# Patient Record
Sex: Male | Born: 1985 | Hispanic: No | Marital: Single | Smoking: Never smoker
Health system: Southern US, Community
[De-identification: ages and names within clinical notes are randomized; demographics above are authoritative.]

## PROBLEM LIST (undated history)

## (undated) DIAGNOSIS — R569 Unspecified convulsions: Secondary | ICD-10-CM

---
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
## 2009-02-16 ENCOUNTER — Emergency Department (HOSPITAL_COMMUNITY): Admission: EM | Admit: 2009-02-16 | Discharge: 2009-02-16 | Payer: Self-pay | Admitting: Ophthalmology

## 2009-07-02 ENCOUNTER — Inpatient Hospital Stay (HOSPITAL_COMMUNITY): Admission: EM | Admit: 2009-07-02 | Discharge: 2009-07-08 | Payer: Self-pay | Admitting: Emergency Medicine

## 2009-07-23 ENCOUNTER — Ambulatory Visit: Payer: Self-pay | Admitting: Physician Assistant

## 2009-07-23 DIAGNOSIS — R030 Elevated blood-pressure reading, without diagnosis of hypertension: Secondary | ICD-10-CM

## 2009-07-23 DIAGNOSIS — R569 Unspecified convulsions: Secondary | ICD-10-CM | POA: Insufficient documentation

## 2009-07-23 DIAGNOSIS — H6120 Impacted cerumen, unspecified ear: Secondary | ICD-10-CM

## 2009-07-23 LAB — CONVERTED CEMR LAB
AST: 19 units/L (ref 0–37)
Alkaline Phosphatase: 89 units/L (ref 39–117)
Amphetamine Screen, Ur: NEGATIVE
BUN: 11 mg/dL (ref 6–23)
Barbiturate Quant, Ur: NEGATIVE
Benzodiazepines.: NEGATIVE
Blood Glucose, Fingerstick: 64
Cocaine Metabolites: NEGATIVE
Creatinine, Ser: 1 mg/dL (ref 0.40–1.50)
Marijuana Metabolite: NEGATIVE
Methadone: NEGATIVE
Phenytoin Lvl: 13.9 ug/mL (ref 10.0–20.0)
Total Bilirubin: 0.6 mg/dL (ref 0.3–1.2)

## 2009-07-24 ENCOUNTER — Encounter: Payer: Self-pay | Admitting: Physician Assistant

## 2009-07-31 ENCOUNTER — Telehealth: Payer: Self-pay | Admitting: Physician Assistant

## 2009-08-11 ENCOUNTER — Telehealth: Payer: Self-pay | Admitting: Physician Assistant

## 2009-08-12 ENCOUNTER — Encounter: Payer: Self-pay | Admitting: Physician Assistant

## 2009-08-17 ENCOUNTER — Encounter (INDEPENDENT_AMBULATORY_CARE_PROVIDER_SITE_OTHER): Payer: Self-pay | Admitting: *Deleted

## 2009-10-09 ENCOUNTER — Telehealth: Payer: Self-pay | Admitting: Physician Assistant

## 2009-10-19 ENCOUNTER — Encounter (INDEPENDENT_AMBULATORY_CARE_PROVIDER_SITE_OTHER): Payer: Self-pay | Admitting: *Deleted

## 2009-11-13 ENCOUNTER — Ambulatory Visit: Payer: Self-pay | Admitting: Physician Assistant

## 2009-11-13 ENCOUNTER — Telehealth: Payer: Self-pay | Admitting: Physician Assistant

## 2009-11-16 LAB — CONVERTED CEMR LAB
BUN: 14 mg/dL (ref 6–23)
CO2: 28 meq/L (ref 19–32)
Calcium: 9.9 mg/dL (ref 8.4–10.5)
Glucose, Bld: 60 mg/dL — ABNORMAL LOW (ref 70–99)
Potassium: 5.2 meq/L (ref 3.5–5.3)

## 2009-11-18 ENCOUNTER — Ambulatory Visit: Payer: Self-pay | Admitting: Physician Assistant

## 2009-11-19 LAB — CONVERTED CEMR LAB: Phenytoin Lvl: 5.7 ug/mL — ABNORMAL LOW (ref 10.0–20.0)

## 2009-11-26 ENCOUNTER — Encounter (INDEPENDENT_AMBULATORY_CARE_PROVIDER_SITE_OTHER): Payer: Self-pay | Admitting: *Deleted

## 2009-12-10 ENCOUNTER — Telehealth (INDEPENDENT_AMBULATORY_CARE_PROVIDER_SITE_OTHER): Payer: Self-pay | Admitting: Nurse Practitioner

## 2010-02-10 ENCOUNTER — Telehealth: Payer: Self-pay | Admitting: Physician Assistant

## 2010-08-10 NOTE — Progress Notes (Signed)
Summary: needs refill on seizure meds  Phone Note Call from Patient Call back at [phone]   Reason for Call: Refill Medication Summary of Call: weaver pt. Douglas Rivas called to say that he finished his medicine and he needs another refill on the seizure medication. he uses [facility] [address]. Initial call taken by: Leodis Rains,  February 10, 2010 11:02 AM  Follow-up for Phone Call        forward to Takita Riecke Follow-up by: Armenia Shannon,  February 11, 2010 7:50 AM  Additional Follow-up for Phone Call Additional follow up Details #1::        I have repeatedly asked for him to be seen.  I have not seen him since his initial visit.  Why have I not seen him?  I cannot keep refilling a medicine for him when I have no follow up with him.  Schedule an appt and let me know date and time.   Additional Follow-up by: Tereso Newcomer PA-C,  February 11, 2010 8:54 AM    Additional Follow-up for Phone Call Additional follow up Details #2::    i spoke with pt and schedule the appt for aug. 30 Follow-up by: Armenia Shannon,  February 11, 2010 11:25 AM  Additional Follow-up for Phone Call Additional follow up Details #3:: Details for Additional Follow-up Action Taken: Rx in basket to fax. Tereso Newcomer PA-C  February 11, 2010 4:51 PM  Spoke with pt's father he confirmed appt. advised him we will fax med to [facility]. Gaylyn Cheers RN  February 12, 2010 9:52 AM    Prescriptions: DILANTIN 100 MG CAPS (PHENYTOIN SODIUM EXTENDED) Two tablets by mouth at bedtime  #60 x 0   Entered and Authorized by:   Tereso Newcomer PA-C   Signed by:   Tereso Newcomer PA-C on 02/11/2010   Method used:   Printed then faxed to ...         RxID:   [id number]

## 2010-08-10 NOTE — Letter (Signed)
Summary: *HSN Results Follow up  [facility]  [address] [place], [place] [address]   Phone: [phone]  Fax: [phone]      11/26/2009   Douglas Rivas [address] [place], [place]  [address]   Dear  Mr. Shalin Hulgan,                            ____S.Drinkard,FNP   ____D. Gore,FNP       ____B. McPherson,MD   ____V. Rankins,MD    ____E. Mulberry,MD    ____N. Daphine Deutscher, FNP  ____D. Reche Dixon, MD    ____K. Philipp Deputy, MD    ____Other     This letter is to inform you that your recent test(s):  _______Pap Smear    ___X____Lab Test     _______X-ray    _______ is within acceptable limits  ___X____ requires a medication change  ____X___ requires a follow-up lab visit  _______ requires a follow-up visit with your provider   Comments:  We have been trying to reach you.  Please give the office a call at your earliest convenience.       _________________________________________________________ If you have any questions, please contact our office                     Sincerely,  Arm[place] Shannon [facility]

## 2010-08-10 NOTE — Assessment & Plan Note (Signed)
Summary: XFU SEIZURES/LR   Vital Signs:  Patient profile:   25 year old male Height:      63 inches Weight:      107.1 pounds BMI:     19.04 Temp:     98.0 degrees F oral Pulse rate:   80 / minute Pulse rhythm:   regular Resp:     17 per minute BP sitting:   147 / 83  (left arm) Cuff size:   regular  Vitals Entered By: Geanie Cooley (July 23, 2009 11:31 AM) CC: one time hospital followup,  pt has had a seizure Is Patient Diabetic? Yes Did you bring your meter with you today? No Pain Assessment Patient in pain? no      CBG Result 64  Does patient need assistance? Functional Status Self care Ambulation Normal    Primary Care Provider:  Tereso Newcomer, PA-C  CC:  one time hospital followup and pt has had a seizure.  History of Present Illness: New patient.  No prior medical care.  Just d/c from hosp. with seizures. Just dx with seizure d/o.  Place on dilantin.  Ran out of meds.  Developed ARF in setting of dehydration.  CT of head concerning for sinus thrombosis.  F/u MRI was normal.  EEG demonstrated bilateral frontal, L>R mild cortical irritability; however, no definite epileptiform features noted.  Spoke with neurologist.  No definite need for f/u with him.  If pt. prefers, he can f/u once or arrange routine f/u. No seizures since d/c.  Taking meds.  No n/v/d.  No difficulty urinating.  No chest pain.  No syncope.  Habits & Providers  Alcohol-Tobacco-Diet     Tobacco Status: never  Exercise-Depression-Behavior     Drug Use: no  Allergies (verified): No Known Drug Allergies                                                                                                                                         Past History:  Past Medical History: Seizure disorder  Past Surgical History: Denies surgical history  Family History: Family History Diabetes 1st degree relative - mom Family History Seizures - mom  Social History: Never Smoked Alcohol  use-no Drug use-no Originally from [place] Single Smoking Status:  never Drug Use:  no  Review of Systems General:  Denies chills and fever. ENT:  Denies earache and sore throat. CV:  Denies chest pain or discomfort. Resp:  Complains of cough; denies sputum productive. GI:  Denies bloody stools and dark tarry stools. MS:  Denies joint pain. Derm:  Denies lesion(s).  Physical Exam  General:  alert, well-developed, and well-nourished.   Head:  normocephalic and atraumatic.   Eyes:  pupils equal, pupils round, and pupils reactive to light.   Ears:  R cerumen impaction and L Cerumen impaction.   Nose:  no external deformity.   Mouth:  pharynx  pink and moist.   Neck:  supple.   Lungs:  normal breath sounds, no crackles, and no wheezes.   Heart:  normal rate and regular rhythm.   Abdomen:  soft, non-tender, and normal bowel sounds.   Neurologic:  alert & oriented X3 and cranial nerves II-XII intact.   Psych:  normally interactive.     Impression & Recommendations:  Problem # 1:  SEIZURE DISORDER (ICD-780.39)  check dilantin level continue meds spoke with neuro pt. can f/u with neuro if desires . . . pt wants to wait until medicaid kicks in  Orders: T-Dilantin (Phenytoin) ([id number])  His updated medication list for this problem includes:    Dilantin 100 Mg Caps (Phenytoin sodium extended) .Marland KitchenMarland KitchenMarland KitchenMarland Kitchen 3 tablets at bedtime  Problem # 2:  Preventive Health Care (ICD-V70.0) sugar high x 1 in hosp. . . ? if he is diabetic  other sugars ok sugar ok today no h/o DM . .  . check A1C with labs  elevated LFTs in hosp . .  . f/u on LFTs  Orders: T-Comprehensive Metabolic Panel [phone]) T- Hemoglobin A1C [phone]) T-Drug Screen-Urine, (single) [id number]) T-HIV Antibody  (Reflex) [id number]) T-Syphilis Test (RPR) [phone]) T-TSH [phone])  Problem # 3:  ELEVATED BP READING WITHOUT DX HYPERTENSION (ICD-796.2)  monitor  start meds if does not  improve  Orders: T-Comprehensive Metabolic Panel ([id number])  Problem # 4:  CERUMEN IMPACTION, BILATERAL (ICD-380.4) irrigation in clinic today  Complete Medication List: 1)  Dilantin 100 Mg Caps (Phenytoin sodium extended) .... 3 tablets at bedtime  Other Orders: Capillary Blood Glucose/CBG ([id number])  Patient Instructions: 1)  Return in 3 weeks for blood pressure check. 2)  Please schedule a follow-up appointment in 6 weeks with Scott for blood pressure and seizures. 3)  Return sooner if you have a seizure Prescriptions: DILANTIN 100 MG CAPS (PHENYTOIN SODIUM EXTENDED) 3 tablets at bedtime  #90 x 5   Entered and Authorized by:   Tereso Newcomer PA-C   Signed by:   Tereso Newcomer PA-C on 07/23/2009   Method used:   Print then Give to Patient   RxID:   [id number]    Vital Signs:  Patient profile:   25 year old male Height:      63 inches Weight:      107.1 pounds BMI:     19.04 Temp:     98.0 degrees F oral Pulse rate:   80 / minute Pulse rhythm:   regular Resp:     17 per minute BP sitting:   147 / 83  (left arm) Cuff size:   regular  Vitals Entered By: Geanie Cooley (July 23, 2009 11:31 AM)

## 2010-08-10 NOTE — Letter (Signed)
Summary: TRIAGE CALL A NURSE  TRIAGE CALL A NURSE   Imported By: Arta Bruce 10/06/2009 14:54:28  _____________________________________________________________________  External Attachment:    Type:   Image     Comment:   External Document

## 2010-08-10 NOTE — Progress Notes (Signed)
  Phone Note Outgoing Call   Summary of Call: Patient called Douglas Rivas. due to inability to get through on our phones. Having lethargy and dizziness. He was told to hold his dilantin x 1 day and then reduce to 2 tablets daily. He needs to come in Monday for a dilantin level. Add on to my schedule Monday as well. Go to the ED this weekend if he feels worse. Initial call taken by: Brynda Rim,  July 31, 2009 4:44 PM  Follow-up for Phone Call        contacted pt advised him not to take the Dilantin today and start sat taking the Dilantin two times a day instead of three times a day, scheduled appt for pt to come in on Monday 08/03/2009 at 11:15am Follow-up by: Geanie Cooley ,  July 31, 2009 4:55 PM

## 2010-08-10 NOTE — Progress Notes (Signed)
  Phone Note Call from Patient   Reason for Call: Refill Medication Summary of Call: NEEDS REFILL ON DILANTIN//[facility] [address]  [phone] Initial call taken by: Arta Bruce,  Nov 13, 2009 9:39 AM  Follow-up for Phone Call        pt can not get refill on meds til pt has appt time and date... Left message on answering machine for pt to call back.... Follow-up by: Arm[place] Shannon,  Nov 13, 2009 11:54 AM  Additional Follow-up for Phone Call Additional follow up Details #1::        pt is aware via kim.... Nakeysha Pasqual just wanted you to know pt's doesn't have update orange card so i told Selena Batten to see if she can get him appts to redo card and to see you afterwards... is there anything different you would like for me to do  Additional Follow-up by: Arm[place] Shannon,  Nov 13, 2009 12:11 PM    Additional Follow-up for Phone Call Additional follow up Details #2::    Needs dilantin level drawn today. Then can give him Rx for 2 weeks. Needs appt. Rx in your basket. Follow-up by: Tereso Newcomer PA-C,  Nov 13, 2009 1:01 PM  Additional Follow-up for Phone Call Additional follow up Details #3:: Details for Additional Follow-up Action Taken: pt is aware Additional Follow-up by: Arm[place] Shannon,  Nov 16, 2009 12:53 PM  Prescriptions: DILANTIN 100 MG CAPS (PHENYTOIN SODIUM EXTENDED) 2 tablets at bedtime  #30 x 0   Entered and Authorized by:   Tereso Newcomer PA-C   Signed by:   Tereso Newcomer PA-C on 11/13/2009   Method used:   Print then Give to Patient   RxID:   [id number]

## 2010-08-10 NOTE — Progress Notes (Signed)
Summary: Dilantin Refill  Phone Note Refill Request Message from:  Patient on December 10, 2009 10:00 AM  Refills Requested: Medication #1:  DILANTIN 100 MG CAPS 2 tablets at bedtime. PT NEEDS TO GET ORANGE CARD RENEWED.... Jordan Hawks Samson Frederic... PT SAYS HE IS GOING TOMORROW TO GET ORANGE CARD.Marland Kitchen   Method Requested: Fax to Local Pharmacy Initial call taken by: Leodis Rains,  December 10, 2009 9:59 AM  Follow-up for Phone Call        30 day supply of meds [facility]ed to [facility] until orange card renewed notify pt Follow-up by: Lehman Prom FNP,  December 10, 2009 10:08 AM  Additional Follow-up for Phone Call Additional follow up Details #1::        pt's father is aware Additional Follow-up by: Arm[place] Shannon,  December 10, 2009 11:23 AM    New/Updated Medications: DILANTIN 100 MG CAPS (PHENYTOIN SODIUM EXTENDED) Two tablets by mouth at bedtime Prescriptions: DILANTIN 100 MG CAPS (PHENYTOIN SODIUM EXTENDED) Two tablets by mouth at bedtime  #60 x 0   Entered and Authorized by:   Lehman Prom FNP   Signed by:   Lehman Prom FNP on 12/10/2009   Method used:   Electronically to        [facility] [address] (retail)       [phone]       [place], [place]  [address]       Ph: [phone]       Fax: [phone]   RxID:   [id number]

## 2010-08-10 NOTE — Progress Notes (Signed)
Summary: REFILL ON DIALANTIN  Phone Note Call from Patient Call back at Home Phone [phone]   Reason for Call: Refill Medication Summary of Call: NEEDS REFILL ON DILANTIN//[facility] [address] Initial call taken by: Arta Bruce,  October 09, 2009 9:57 AM  Follow-up for Phone Call        forward to provider......... Follow-up by: Mikey College CMA,  October 09, 2009 10:47 AM  Additional Follow-up for Phone Call Additional follow up Details #1::        patient with multiple no shows he needs a f/u appt ASAP I will not refill this med again until I see him he was supposed to f/u weeks ago as we thought he was having side effects  Additional Follow-up by: Brynda Rim,  October 09, 2009 3:01 PM    Additional Follow-up for Phone Call Additional follow up Details #2::    Left message on answering machine for pt to call back.Marland KitchenMarland KitchenMarland KitchenArmenia Shannon  October 12, 2009 6:24 PM left message on answer machine for pt. to return call Gaylyn Cheers RN  October 13, 2009 10:29 AM   Left message on answering machine for pt to call back.... Armenia Shannon  October 14, 2009 12:49 PM.  Left message on answering machine for pt to call back.... will mail letter.... Arm[place] Shannon  October 19, 2009 10:23 AM    New/Updated Medications: DILANTIN 100 MG CAPS (PHENYTOIN SODIUM EXTENDED) 2 tablets at bedtime Prescriptions: DILANTIN 100 MG CAPS (PHENYTOIN SODIUM EXTENDED) 2 tablets at bedtime  #60 x 0   Entered and Authorized by:   Tereso Newcomer PA-C   Signed by:   Tereso Newcomer PA-C on 10/09/2009   Method used:   Electronically to        [facility] [address] (retail)       [phone]       [place], [place]  [address]       Ph: [phone]       Fax: [phone]   RxID:   [id number]

## 2010-08-10 NOTE — Letter (Signed)
Summary: *HSN Results Follow up  [facility]  [address] [place], [place] [address]   Phone: [phone]  Fax: [phone]      10/19/2009   Quindon Friedmann [address] [place], [place]  [address]   Dear  Mr. Jettson Droege,                            ____S.Drinkard,FNP   ____D. Gore,FNP       ____B. McPherson,MD   ____V. Rankins,MD    ____E. Mulberry,MD    ____N. Daphine Deutscher, FNP  ____D. Reche Dixon, MD    ____K. Philipp Deputy, MD    ____Other     This letter is to inform you that your recent test(s):  _______Pap Smear    _______Lab Test     _______X-ray    _______ is within acceptable limits  _______ requires a medication change  _______ requires a follow-up lab visit  ___X____ requires a follow-up visit with your provider   Comments:  We have been trying to reach you.  Please give the office a call at your earliest convenience.       _________________________________________________________ If you have any questions, please contact our office                     Sincerely,  Arm[place] Shannon [facility]

## 2010-08-10 NOTE — Progress Notes (Signed)
Summary: REFILL ON SEIZURE MEDS  Phone Note Call from Patient Call back at Home Phone [phone]   Reason for Call: Refill Medication Summary of Call: WE[facility]ER PT. MR Sauerwein CALLED AND SAYS THAT HE LOST HIS DIALANTIN RX THAT YOU WROT FOR HIM ON HIS LAST VISIT AND HE WOULD IIKE FOR [place] TO CALL IT INTO [facility] [address]. HE SAYS THAT HE TAKES HIS LAST ONE TODAY. Initial call taken by: Leodis Rains,  August 11, 2009 12:54 PM  Follow-up for Phone Call        The pt is very desperate because he states that he needs his medication by today.Manon Hilding  August 11, 2009 3:45 PM  Additional Follow-up for Phone Call Additional follow up Details #1::        pt has refills at pharmacy..... Left message on answering machine for pt to call back...Marland KitchenMarland KitchenArmenia Shannon  August 13, 2009 5:08 PM    Left message on answering machine for pt to call back.Marland KitchenMarland KitchenMarland KitchenArmenia Shannon  August 14, 2009 3:46 PM     Additional Follow-up for Phone Call Additional follow up Details #2::    Left message on answering machine for pt to call back.Marland KitchenMarland KitchenArmenia Shannon  August 17, 2009 1:05 PM

## 2010-08-10 NOTE — Letter (Signed)
Summary: *HSN Results Follow up  [facility]  [address] [place], [place] [address]   Phone: [phone]  Fax: [phone]      07/24/2009   Douglas Rivas [address] [place], [place]  [address]   Dear  Mr. [name],                            ____S.Drinkard,FNP   ____D. Gore,FNP       ____B. McPherson,MD   ____V. Rankins,MD    ____E. Mulberry,MD    ____N. Daphine Deutscher, FNP  ____D. Reche Dixon, MD    ____K. Philipp Deputy, MD    __x__S. Alben Spittle, PA-C     This letter is to inform you that your recent test(s):  _______Pap Smear    ___x____Lab Test     _______X-ray    ___x____ is within acceptable limits  _______ requires a medication change  _______ requires a follow-up lab visit  _______ requires a follow-up visit with your provider   Comments:  You do not have diabetes.  Your dilantin level looks good.  Everything else was normal.         _________________________________________________________ If you have any questions, please contact our office                     Sincerely,  Tereso Newcomer PA-C [facility]

## 2010-08-10 NOTE — Letter (Signed)
Summary: *HSN Results Follow up  [facility]  [address] [place], [place] [address]   Phone: [phone]  Fax: [phone]      08/17/2009   Fatih Ruperto [address] [place], [place]  [address]   Dear  Mr. Rulon Stubbe,                            ____S.Drinkard,FNP   ____D. Gore,FNP       ____B. McPherson,MD   ____V. Rankins,MD    ____E. Mulberry,MD    ____N. Daphine Deutscher, FNP  ____D. Reche Dixon, MD    ____K. Philipp Deputy, MD    ____Other     This letter is to inform you that your recent test(s):  _______Pap Smear    _______Lab Test     _______X-ray    _______ is within acceptable limits  _______ requires a medication change  _______ requires a follow-up lab visit  _______ requires a follow-up visit with your provider   Comments:  We have been trying to reach you.  Please give the office a call at your earliest convenience.       _________________________________________________________ If you have any questions, please contact our office                     Sincerely,  [place] Shannon [facility]

## 2010-10-11 LAB — GLUCOSE, CAPILLARY
Glucose-Capillary: 103 mg/dL — ABNORMAL HIGH (ref 70–99)
Glucose-Capillary: 113 mg/dL — ABNORMAL HIGH (ref 70–99)
Glucose-Capillary: 121 mg/dL — ABNORMAL HIGH (ref 70–99)
Glucose-Capillary: 126 mg/dL — ABNORMAL HIGH (ref 70–99)
Glucose-Capillary: 139 mg/dL — ABNORMAL HIGH (ref 70–99)
Glucose-Capillary: 63 mg/dL — ABNORMAL LOW (ref 70–99)
Glucose-Capillary: 65 mg/dL — ABNORMAL LOW (ref 70–99)
Glucose-Capillary: 75 mg/dL (ref 70–99)
Glucose-Capillary: 76 mg/dL (ref 70–99)
Glucose-Capillary: 81 mg/dL (ref 70–99)
Glucose-Capillary: 88 mg/dL (ref 70–99)
Glucose-Capillary: 88 mg/dL (ref 70–99)
Glucose-Capillary: 91 mg/dL (ref 70–99)
Glucose-Capillary: 94 mg/dL (ref 70–99)
Glucose-Capillary: 97 mg/dL (ref 70–99)

## 2010-10-11 LAB — RAPID URINE DRUG SCREEN, HOSP PERFORMED
Benzodiazepines: NOT DETECTED
Cocaine: NOT DETECTED
Opiates: NOT DETECTED

## 2010-10-11 LAB — CBC
HCT: 43.5 % (ref 39.0–52.0)
HCT: 43.9 % (ref 39.0–52.0)
HCT: 50.9 % (ref 39.0–52.0)
Hemoglobin: 15.7 g/dL (ref 13.0–17.0)
Hemoglobin: 18.4 g/dL — ABNORMAL HIGH (ref 13.0–17.0)
MCHC: 34 g/dL (ref 30.0–36.0)
MCHC: 34.6 g/dL (ref 30.0–36.0)
MCHC: 34.9 g/dL (ref 30.0–36.0)
MCHC: 35 g/dL (ref 30.0–36.0)
MCV: 91.9 fL (ref 78.0–100.0)
MCV: 92.5 fL (ref 78.0–100.0)
MCV: 94.2 fL (ref 78.0–100.0)
Platelets: 165 10*3/uL (ref 150–400)
Platelets: 168 10*3/uL (ref 150–400)
Platelets: 178 10*3/uL (ref 150–400)
Platelets: 184 10*3/uL (ref 150–400)
Platelets: 209 10*3/uL (ref 150–400)
RBC: 4.8 MIL/uL (ref 4.22–5.81)
RBC: 5.49 MIL/uL (ref 4.22–5.81)
RBC: 5.75 MIL/uL (ref 4.22–5.81)
RDW: 12.5 % (ref 11.5–15.5)
RDW: 12.5 % (ref 11.5–15.5)
RDW: 12.5 % (ref 11.5–15.5)
RDW: 12.6 % (ref 11.5–15.5)
WBC: 17.7 10*3/uL — ABNORMAL HIGH (ref 4.0–10.5)
WBC: 6.6 10*3/uL (ref 4.0–10.5)

## 2010-10-11 LAB — DIFFERENTIAL
Basophils Absolute: 0 10*3/uL (ref 0.0–0.1)
Basophils Absolute: 0 10*3/uL (ref 0.0–0.1)
Basophils Absolute: 0.1 10*3/uL (ref 0.0–0.1)
Basophils Relative: 0 % (ref 0–1)
Basophils Relative: 0 % (ref 0–1)
Basophils Relative: 1 % (ref 0–1)
Eosinophils Absolute: 0 10*3/uL (ref 0.0–0.7)
Eosinophils Absolute: 0.1 10*3/uL (ref 0.0–0.7)
Eosinophils Relative: 0 % (ref 0–5)
Eosinophils Relative: 0 % (ref 0–5)
Eosinophils Relative: 1 % (ref 0–5)
Eosinophils Relative: 2 % (ref 0–5)
Lymphocytes Relative: 28 % (ref 12–46)
Lymphocytes Relative: 47 % — ABNORMAL HIGH (ref 12–46)
Lymphocytes Relative: 9 % — ABNORMAL LOW (ref 12–46)
Monocytes Absolute: 0.5 10*3/uL (ref 0.1–1.0)
Monocytes Absolute: 0.7 10*3/uL (ref 0.1–1.0)
Monocytes Relative: 5 % (ref 3–12)
Monocytes Relative: 7 % (ref 3–12)
Neutro Abs: 3.9 10*3/uL (ref 1.7–7.7)
Neutro Abs: 4.4 10*3/uL (ref 1.7–7.7)
Neutro Abs: 8.4 10*3/uL — ABNORMAL HIGH (ref 1.7–7.7)
Neutrophils Relative %: 47 % (ref 43–77)
Neutrophils Relative %: 66 % (ref 43–77)

## 2010-10-11 LAB — HEPATIC FUNCTION PANEL
AST: 241 U/L — ABNORMAL HIGH (ref 0–37)
Albumin: 3.9 g/dL (ref 3.5–5.2)
Alkaline Phosphatase: 64 U/L (ref 39–117)
Bilirubin, Direct: 0.1 mg/dL (ref 0.0–0.3)
Total Bilirubin: 0.5 mg/dL (ref 0.3–1.2)

## 2010-10-11 LAB — PHENYTOIN LEVEL, FREE AND TOTAL
Phenytoin Bound: 17.9 ug/mL
Phenytoin, Free: 1.78 ug/mL (ref 1.00–2.00)

## 2010-10-11 LAB — URINALYSIS, MICROSCOPIC ONLY
Bilirubin Urine: NEGATIVE
Glucose, UA: NEGATIVE mg/dL
Ketones, ur: 15 mg/dL — AB
Protein, ur: NEGATIVE mg/dL
pH: 6 (ref 5.0–8.0)

## 2010-10-11 LAB — BASIC METABOLIC PANEL
BUN: 13 mg/dL (ref 6–23)
BUN: 3 mg/dL — ABNORMAL LOW (ref 6–23)
BUN: 4 mg/dL — ABNORMAL LOW (ref 6–23)
CO2: 19 mEq/L (ref 19–32)
CO2: 25 mEq/L (ref 19–32)
CO2: 25 mEq/L (ref 19–32)
CO2: 30 mEq/L (ref 19–32)
CO2: 31 mEq/L (ref 19–32)
Calcium: 8.1 mg/dL — ABNORMAL LOW (ref 8.4–10.5)
Calcium: 8.3 mg/dL — ABNORMAL LOW (ref 8.4–10.5)
Calcium: 8.8 mg/dL (ref 8.4–10.5)
Chloride: 103 mEq/L (ref 96–112)
Chloride: 105 mEq/L (ref 96–112)
Chloride: 106 mEq/L (ref 96–112)
Chloride: 107 mEq/L (ref 96–112)
Creatinine, Ser: 0.95 mg/dL (ref 0.4–1.5)
Creatinine, Ser: 1.1 mg/dL (ref 0.4–1.5)
Creatinine, Ser: 1.12 mg/dL (ref 0.4–1.5)
Creatinine, Ser: 1.26 mg/dL (ref 0.4–1.5)
GFR calc Af Amer: >60 mL/min (ref >60)
GFR calc Af Amer: >60 mL/min (ref >60)
GFR calc Af Amer: >60 mL/min (ref >60)
GFR calc non Af Amer: >60 mL/min (ref >60)
GFR calc non Af Amer: >60 mL/min (ref >60)
Glucose, Bld: 108 mg/dL — ABNORMAL HIGH (ref 70–99)
Glucose, Bld: 68 mg/dL — ABNORMAL LOW (ref 70–99)
Potassium: 3.5 mEq/L (ref 3.5–5.1)
Potassium: 4.1 mEq/L (ref 3.5–5.1)
Potassium: 4.3 mEq/L (ref 3.5–5.1)
Sodium: 134 mEq/L — ABNORMAL LOW (ref 135–145)
Sodium: 136 mEq/L (ref 135–145)
Sodium: 141 mEq/L (ref 135–145)

## 2010-10-11 LAB — COMPREHENSIVE METABOLIC PANEL
Albumin: 4.5 g/dL (ref 3.5–5.2)
Alkaline Phosphatase: 102 U/L (ref 39–117)
BUN: 13 mg/dL (ref 6–23)
Calcium: 9.3 mg/dL (ref 8.4–10.5)
Glucose, Bld: 281 mg/dL — ABNORMAL HIGH (ref 70–99)
Potassium: 2.9 mEq/L — ABNORMAL LOW (ref 3.5–5.1)
Total Protein: 8.5 g/dL — ABNORMAL HIGH (ref 6.0–8.3)

## 2010-10-11 LAB — MAGNESIUM
Magnesium: 1.8 mg/dL (ref 1.5–2.5)
Magnesium: 2.5 mg/dL (ref 1.5–2.5)

## 2010-10-11 LAB — PATHOLOGIST SMEAR REVIEW

## 2010-10-11 LAB — URINE CULTURE
Colony Count: NO GROWTH
Special Requests: NEGATIVE

## 2010-10-11 LAB — PHENYTOIN LEVEL, TOTAL: Phenytoin Lvl: 25.7 ug/mL — ABNORMAL HIGH (ref 10.0–20.0)

## 2010-10-16 LAB — CBC
HCT: 54.1 % — ABNORMAL HIGH (ref 39.0–52.0)
Hemoglobin: 18.4 g/dL — ABNORMAL HIGH (ref 13.0–17.0)
MCHC: 34.1 g/dL (ref 30.0–36.0)
Platelets: 222 10*3/uL (ref 150–400)
RDW: 13.1 % (ref 11.5–15.5)

## 2010-10-16 LAB — COMPREHENSIVE METABOLIC PANEL
Alkaline Phosphatase: 97 U/L (ref 39–117)
BUN: 11 mg/dL (ref 6–23)
Calcium: 9.2 mg/dL (ref 8.4–10.5)
Creatinine, Ser: 1.32 mg/dL (ref 0.4–1.5)
Glucose, Bld: 120 mg/dL — ABNORMAL HIGH (ref 70–99)
Total Protein: 8.9 g/dL — ABNORMAL HIGH (ref 6.0–8.3)

## 2010-10-16 LAB — URINALYSIS, ROUTINE W REFLEX MICROSCOPIC
Ketones, ur: NEGATIVE mg/dL
Nitrite: NEGATIVE
Specific Gravity, Urine: 1.016 (ref 1.005–1.030)
pH: 6 (ref 5.0–8.0)

## 2010-10-16 LAB — PATHOLOGIST SMEAR REVIEW

## 2010-10-16 LAB — DIFFERENTIAL
Eosinophils Relative: 1 % (ref 0–5)
Lymphocytes Relative: 47 % — ABNORMAL HIGH (ref 12–46)
Monocytes Absolute: 1.1 10*3/uL — ABNORMAL HIGH (ref 0.1–1.0)
Monocytes Relative: 7 % (ref 3–12)
Neutrophils Relative %: 45 % (ref 43–77)

## 2010-10-16 LAB — RAPID URINE DRUG SCREEN, HOSP PERFORMED: Tetrahydrocannabinol: NOT DETECTED

## 2010-10-16 LAB — GLUCOSE, CAPILLARY

## 2010-10-16 LAB — PROTIME-INR: Prothrombin Time: 14.2 seconds (ref 11.6–15.2)

## 2010-10-19 ENCOUNTER — Emergency Department (HOSPITAL_COMMUNITY): Payer: Self-pay

## 2010-10-19 ENCOUNTER — Emergency Department (HOSPITAL_COMMUNITY)
Admission: EM | Admit: 2010-10-19 | Discharge: 2010-10-19 | Disposition: A | Discharge status: Home / Self Care | Payer: Self-pay | Attending: Emergency Medicine | Admitting: Emergency Medicine

## 2010-10-19 DIAGNOSIS — S0083XA Contusion of other part of head, initial encounter: Secondary | ICD-10-CM | POA: Insufficient documentation

## 2010-10-19 DIAGNOSIS — R51 Headache: Secondary | ICD-10-CM | POA: Insufficient documentation

## 2010-10-19 DIAGNOSIS — G40909 Epilepsy, unspecified, not intractable, without status epilepticus: Secondary | ICD-10-CM | POA: Insufficient documentation

## 2010-10-19 DIAGNOSIS — Z91199 Patient's noncompliance with other medical treatment and regimen due to unspecified reason: Secondary | ICD-10-CM | POA: Insufficient documentation

## 2010-10-19 DIAGNOSIS — Z9119 Patient's noncompliance with other medical treatment and regimen: Secondary | ICD-10-CM | POA: Insufficient documentation

## 2010-10-19 DIAGNOSIS — S0003XA Contusion of scalp, initial encounter: Secondary | ICD-10-CM | POA: Insufficient documentation

## 2010-10-19 DIAGNOSIS — X58XXXA Exposure to other specified factors, initial encounter: Secondary | ICD-10-CM | POA: Insufficient documentation

## 2010-10-19 LAB — CBC
MCH: 32.8 pg (ref 26.0–34.0)
MCHC: 38.3 g/dL — ABNORMAL HIGH (ref 30.0–36.0)
MCV: 85.5 fL (ref 78.0–100.0)
Platelets: 227 10*3/uL (ref 150–400)
Platelets: 245 10*3/uL (ref 150–400)
RDW: 13.1 % (ref 11.5–15.5)
WBC: 21.8 10*3/uL — ABNORMAL HIGH (ref 4.0–10.5)

## 2010-10-19 LAB — POCT I-STAT, CHEM 8
BUN: 15 mg/dL (ref 6–23)
Calcium, Ion: 1.08 mmol/L — ABNORMAL LOW (ref 1.12–1.32)
TCO2: 21 mmol/L (ref 0–100)

## 2010-11-16 ENCOUNTER — Emergency Department (HOSPITAL_COMMUNITY): Payer: Self-pay

## 2010-11-16 ENCOUNTER — Emergency Department (HOSPITAL_COMMUNITY)
Admission: EM | Admit: 2010-11-16 | Discharge: 2010-11-17 | Disposition: A | Discharge status: Home / Self Care | Payer: Self-pay | Attending: Emergency Medicine | Admitting: Emergency Medicine

## 2010-11-16 DIAGNOSIS — Y9367 Activity, basketball: Secondary | ICD-10-CM | POA: Insufficient documentation

## 2010-11-16 DIAGNOSIS — X58XXXA Exposure to other specified factors, initial encounter: Secondary | ICD-10-CM | POA: Insufficient documentation

## 2010-11-16 DIAGNOSIS — S43016A Anterior dislocation of unspecified humerus, initial encounter: Secondary | ICD-10-CM | POA: Insufficient documentation

## 2011-05-30 ENCOUNTER — Emergency Department (HOSPITAL_COMMUNITY): Payer: Self-pay

## 2011-05-30 ENCOUNTER — Emergency Department (HOSPITAL_COMMUNITY)
Admission: EM | Admit: 2011-05-30 | Discharge: 2011-05-30 | Disposition: A | Discharge status: Home / Self Care | Payer: Self-pay | Attending: Emergency Medicine | Admitting: Emergency Medicine

## 2011-05-30 ENCOUNTER — Encounter: Payer: Self-pay | Admitting: Emergency Medicine

## 2011-05-30 ENCOUNTER — Other Ambulatory Visit: Payer: Self-pay

## 2011-05-30 DIAGNOSIS — G40909 Epilepsy, unspecified, not intractable, without status epilepticus: Secondary | ICD-10-CM | POA: Insufficient documentation

## 2011-05-30 DIAGNOSIS — R062 Wheezing: Secondary | ICD-10-CM | POA: Insufficient documentation

## 2011-05-30 DIAGNOSIS — Z91199 Patient's noncompliance with other medical treatment and regimen due to unspecified reason: Secondary | ICD-10-CM | POA: Insufficient documentation

## 2011-05-30 DIAGNOSIS — R5381 Other malaise: Secondary | ICD-10-CM | POA: Insufficient documentation

## 2011-05-30 DIAGNOSIS — Z9119 Patient's noncompliance with other medical treatment and regimen: Secondary | ICD-10-CM | POA: Insufficient documentation

## 2011-05-30 DIAGNOSIS — R000 Tachycardia, unspecified: Secondary | ICD-10-CM | POA: Insufficient documentation

## 2011-05-30 DIAGNOSIS — Z9114 Patient's other noncompliance with medication regimen: Secondary | ICD-10-CM

## 2011-05-30 HISTORY — DX: Unspecified convulsions: R56.9

## 2011-05-30 LAB — POCT I-STAT, CHEM 8
Calcium, Ion: 1.07 mmol/L — ABNORMAL LOW (ref 1.12–1.32)
HCT: 59 % — ABNORMAL HIGH (ref 39.0–52.0)
Hemoglobin: 20.1 g/dL — ABNORMAL HIGH (ref 13.0–17.0)
Hemoglobin: 18 g/dL — ABNORMAL HIGH (ref 13.0–17.0)
Sodium: 137 mEq/L (ref 135–145)
Sodium: 139 mEq/L (ref 135–145)
TCO2: 10 mmol/L (ref 0–100)
TCO2: 23 mmol/L (ref 0–100)

## 2011-05-30 LAB — ETHANOL: Alcohol, Ethyl (B): <11 mg/dL (ref 0–11)

## 2011-05-30 MED ORDER — SODIUM CHLORIDE 0.9 % IV BOLUS (SEPSIS): 1000.0000 mL | Freq: Once | INTRAVENOUS | Status: DC

## 2011-05-30 MED ORDER — PHENYTOIN SODIUM EXTENDED 200 MG PO CAPS: 200.0000 mg | ORAL_CAPSULE | Freq: Every day | ORAL | Status: DC

## 2011-05-30 MED ORDER — SODIUM CHLORIDE 0.9 % IV SOLN
1000.0000 mg | Freq: Once | INTRAVENOUS | Status: AC
  Administered 2011-05-30: 1000 mg via INTRAVENOUS
  Filled 2011-05-30: qty 20

## 2011-05-30 MED ORDER — ONDANSETRON HCL 4 MG/2ML IJ SOLN
4.0000 mg | Freq: Once | INTRAMUSCULAR | Status: AC
  Administered 2011-05-30: 4 mg via INTRAVENOUS
  Filled 2011-05-30: qty 2

## 2011-05-30 NOTE — ED Provider Notes (Signed)
History     CSN: [id number] Arrival date & time: 05/30/2011  7:13 AM   First MD Initiated Contact with Patient 05/30/11 [phone]      Chief Complaint  Patient presents with  . Seizures    HPI  Level 5 caveat AMS. 25 year old male history of seizure disorder presents with altered mental status. The history is provided by the patient's stepfather is the patient has altered mental status at this time. Stepfather says that he woke up and heard patient grunting this morning. He opened the door and found the patient was having what appeared to be a generalized tonic-clonic seizure. He states that this is consistent with his previous seizures. He states that he showed the patient here took approximately 5 minutes and he stopped seizing on arrival to the emergency department. Stepfather denies alcohol, drug use. He states that he has not taken his Dilantin in "like some time". He does not have a neurologist or primary care doctor at this time. Glucose greater than 200 on arrival   Past Medical History  Diagnosis Date  . Seizures    Seizure d/o  History reviewed. No pertinent past surgical history. Unknown per family (stepfather)  History reviewed. No pertinent family history. Noncontributory  History  Substance Use Topics  . Smoking status: Unknown If Ever Smoked  . Smokeless tobacco: Not on file  . Alcohol Use: Yes      Review of Systems  Unable to perform ROS  Unable to assess 2/2 AMS  Allergies  Review of patient's allergies indicates no known allergies.  Home Medications   Current Outpatient Rx  Name Route Sig Dispense Refill  . PHENYTOIN SODIUM EXTENDED 200 MG PO CAPS Oral Take 1 capsule (200 mg total) by mouth daily. 60 capsule 0    BP 154/115  Pulse 103  Temp 98.8 F (37.1 C)  Resp 24  SpO2 98%  Physical Exam  Nursing note and vitals reviewed. Constitutional: He is oriented to person, place, and time. He appears well-developed and well-nourished.  HENT:    Head: Normocephalic and atraumatic.  Mouth/Throat: Oropharynx is clear and moist.       Mm dry  Eyes: Conjunctivae are normal. Pupils are equal, round, and reactive to light.  Neck: Neck supple.  Cardiovascular: Regular rhythm, normal heart sounds and intact distal pulses.  Exam reveals no gallop and no friction rub.   No murmur heard.      tachycardic  Pulmonary/Chest: Effort normal. No respiratory distress. He has wheezes. He has no rales.       Min wheeze Lt base  Abdominal: Soft. Bowel sounds are normal. There is no rebound and no guarding.  Musculoskeletal: Normal range of motion. He exhibits no edema and no tenderness.  Neurological: He is alert and oriented to person, place, and time.       Lethargic  GCS 9 E 2 V 2 M 5  Skin: Skin is warm and dry.    Date: 05/30/2011  Rate: 120  Rhythm: sinus tachycardia  QRS Axis: normal  Intervals: normal  ST/T Wave abnormalities: nonspecific ST changes  Conduction Disutrbances:none  Narrative Interpretation:   Old EKG Reviewed: changes noted   ED Course  Procedures (including critical care time)  Labs Reviewed  POCT I-STAT, CHEM 8 - Abnormal; Notable for the following:    Creatinine, Ser 1.40 (*)    Glucose, Bld 265 (*)    Hemoglobin 20.1 (*)    HCT 59.0 (*)    All other components  within normal limits  POCT I-STAT, CHEM 8 - Abnormal; Notable for the following:    Glucose, Bld 114 (*)    Calcium, Ion 1.07 (*)    Hemoglobin 18.0 (*)    HCT 53.0 (*)    All other components within normal limits  ETHANOL  I-STAT, CHEM 8   Dg Chest Portable 1 View  05/30/2011  *RADIOLOGY REPORT*  Clinical Data: Wheezing.  Seizure.  Altered mental status.  PORTABLE CHEST - 1 VIEW  Comparison: 07/03/2009  Findings: The heart size and vascularity are normal with clear.  No effusions.  No osseous abnormalities.  IMPRESSION: Normal chest.  Original Report Authenticated By: Gwynn Burly, M.D.     1. Seizure disorder   2. History of  medication noncompliance     MDM  H/o seizure d/o noncompliant with dilantin. Seizure today. Post ictal. Dilantin ordered. Labs pending.  Stefano Gaul, MD  [phone]  Patient postictal but improving. Dilantin infusing. Family at bedside. Continue to monitor.  1040 Patient continues to remain lethargic but is answering questions appropriately. C/O mild headache, states he does usually have headache after his seizures. Tylenol ordered.  1442 Patient answering questions. Ambulatory per nursing staff. I stat bicarb improved. Waiting arrival of family.  Stefano Gaul, MD   [phone] Family at bedside. Pt tolerating PO. Home with stepfather. Fu PMD   Forbes Cellar, MD 05/30/11 1513

## 2011-05-30 NOTE — ED Notes (Signed)
Pt offered sandwich pt refused sandwich at this time. Pt given po sprite

## 2011-05-30 NOTE — ED Notes (Signed)
Pt walked fine without assistance but is still incontinent of urine at this time. He is being provided with clean pants. In NAD.

## 2011-05-30 NOTE — ED Notes (Signed)
Pt presented to ED after his step-father heard him grunting in the room and found this seizing in the home. He arrived here by taxi and was seizing upon arrival. Pt is postictal at this point.

## 2011-05-30 NOTE — ED Notes (Signed)
Family called to come get pt. Pt sleeping at this time.

## 2011-05-30 NOTE — ED Notes (Signed)
Pt waking up more. Still not able to verbalize. Continually wipes his face and has thrown off his  but his O2 sats stay above 92 for the most part. Dilantin infusion complete. In NAD at this time.

## 2011-05-30 NOTE — ED Notes (Signed)
Dr Hyman Hopes notified of abnormal lab value.

## 2011-06-07 LAB — GLUCOSE, CAPILLARY: Glucose-Capillary: 267 mg/dL — ABNORMAL HIGH (ref 70–99)

## 2011-07-30 ENCOUNTER — Inpatient Hospital Stay (HOSPITAL_COMMUNITY): Payer: Self-pay

## 2011-07-30 ENCOUNTER — Emergency Department (HOSPITAL_COMMUNITY): Payer: Self-pay

## 2011-07-30 ENCOUNTER — Inpatient Hospital Stay (HOSPITAL_COMMUNITY)
Admission: EM | Admit: 2011-07-30 | Discharge: 2011-08-02 | DRG: 100 | Disposition: A | Discharge status: Home / Self Care | Payer: Self-pay | Attending: Internal Medicine | Admitting: Internal Medicine

## 2011-07-30 ENCOUNTER — Other Ambulatory Visit: Payer: Self-pay

## 2011-07-30 DIAGNOSIS — R030 Elevated blood-pressure reading, without diagnosis of hypertension: Secondary | ICD-10-CM | POA: Diagnosis present

## 2011-07-30 DIAGNOSIS — Z91199 Patient's noncompliance with other medical treatment and regimen due to unspecified reason: Secondary | ICD-10-CM

## 2011-07-30 DIAGNOSIS — E872 Acidosis, unspecified: Secondary | ICD-10-CM | POA: Diagnosis present

## 2011-07-30 DIAGNOSIS — H6120 Impacted cerumen, unspecified ear: Secondary | ICD-10-CM | POA: Diagnosis present

## 2011-07-30 DIAGNOSIS — R1319 Other dysphagia: Secondary | ICD-10-CM | POA: Diagnosis present

## 2011-07-30 DIAGNOSIS — G40401 Other generalized epilepsy and epileptic syndromes, not intractable, with status epilepticus: Principal | ICD-10-CM | POA: Diagnosis present

## 2011-07-30 DIAGNOSIS — R569 Unspecified convulsions: Secondary | ICD-10-CM | POA: Diagnosis present

## 2011-07-30 DIAGNOSIS — R7889 Finding of other specified substances, not normally found in blood: Secondary | ICD-10-CM

## 2011-07-30 DIAGNOSIS — E876 Hypokalemia: Secondary | ICD-10-CM | POA: Diagnosis present

## 2011-07-30 DIAGNOSIS — Z9119 Patient's noncompliance with other medical treatment and regimen: Secondary | ICD-10-CM

## 2011-07-30 DIAGNOSIS — J690 Pneumonitis due to inhalation of food and vomit: Secondary | ICD-10-CM | POA: Diagnosis present

## 2011-07-30 DIAGNOSIS — D72829 Elevated white blood cell count, unspecified: Secondary | ICD-10-CM | POA: Diagnosis present

## 2011-07-30 DIAGNOSIS — G40901 Epilepsy, unspecified, not intractable, with status epilepticus: Secondary | ICD-10-CM | POA: Diagnosis present

## 2011-07-30 DIAGNOSIS — M6282 Rhabdomyolysis: Secondary | ICD-10-CM | POA: Diagnosis present

## 2011-07-30 LAB — BLOOD GAS, VENOUS
Acid-Base Excess: 0.4 mmol/L (ref 0.0–2.0)
Bicarbonate: 24.6 mEq/L — ABNORMAL HIGH (ref 20.0–24.0)
O2 Saturation: 89.3 %
O2 Saturation: 99.2 %
Patient temperature: 98.6
Patient temperature: 98.6
TCO2: 12.6 mmol/L (ref 0–100)
TCO2: 20.8 mmol/L (ref 0–100)
pCO2, Ven: 40.3 mmHg — ABNORMAL LOW (ref 45.0–50.0)
pH, Ven: 7.219 — ABNORMAL LOW (ref 7.250–7.300)

## 2011-07-30 LAB — URINALYSIS, ROUTINE W REFLEX MICROSCOPIC
Ketones, ur: NEGATIVE mg/dL
Leukocytes, UA: NEGATIVE
Nitrite: NEGATIVE
Specific Gravity, Urine: 1.014 (ref 1.005–1.030)
pH: 8.5 — ABNORMAL HIGH (ref 5.0–8.0)

## 2011-07-30 LAB — CBC
MCH: 32.1 pg (ref 26.0–34.0)
MCHC: 36.7 g/dL — ABNORMAL HIGH (ref 30.0–36.0)
Platelets: 296 10*3/uL (ref 150–400)
RDW: 13.4 % (ref 11.5–15.5)

## 2011-07-30 LAB — CARDIAC PANEL(CRET KIN+CKTOT+MB+TROPI)
CK, MB: 3.2 ng/mL (ref 0.3–4.0)
Total CK: 723 U/L — ABNORMAL HIGH (ref 7–232)

## 2011-07-30 LAB — DIFFERENTIAL
Eosinophils Absolute: 0.8 10*3/uL — ABNORMAL HIGH (ref 0.0–0.7)
Monocytes Absolute: 1.8 10*3/uL — ABNORMAL HIGH (ref 0.1–1.0)
Neutrophils Relative %: 34 % — ABNORMAL LOW (ref 43–77)

## 2011-07-30 LAB — COMPREHENSIVE METABOLIC PANEL
CO2: 23 mEq/L (ref 19–32)
Calcium: 7.9 mg/dL — ABNORMAL LOW (ref 8.4–10.5)
Creatinine, Ser: 1.1 mg/dL (ref 0.50–1.35)
GFR calc Af Amer: >90 mL/min (ref >90)
GFR calc non Af Amer: >90 mL/min (ref >90)
Glucose, Bld: 103 mg/dL — ABNORMAL HIGH (ref 70–99)
Total Protein: 7.6 g/dL (ref 6.0–8.3)

## 2011-07-30 LAB — RAPID URINE DRUG SCREEN, HOSP PERFORMED
Barbiturates: NOT DETECTED
Benzodiazepines: NOT DETECTED
Tetrahydrocannabinol: NOT DETECTED

## 2011-07-30 LAB — SALICYLATE LEVEL: Salicylate Lvl: <2 mg/dL — ABNORMAL LOW (ref 2.8–20.0)

## 2011-07-30 LAB — MRSA PCR SCREENING: MRSA by PCR: NEGATIVE

## 2011-07-30 LAB — PHENYTOIN LEVEL, TOTAL: Phenytoin Lvl: <2.5 ug/mL — ABNORMAL LOW (ref 10.0–20.0)

## 2011-07-30 MED ORDER — IPRATROPIUM BROMIDE 0.02 % IN SOLN
0.5000 mg | Freq: Four times a day (QID) | RESPIRATORY_TRACT | Status: DC
  Administered 2011-07-30 – 2011-07-31 (×5): 0.5 mg via RESPIRATORY_TRACT
  Filled 2011-07-30 (×5): qty 2.5

## 2011-07-30 MED ORDER — POTASSIUM CHLORIDE IN NACL 40-0.9 MEQ/L-% IV SOLN
INTRAVENOUS | Status: DC
  Administered 2011-07-30: 75 mL/h via INTRAVENOUS
  Administered 2011-07-31 – 2011-08-01 (×3): via INTRAVENOUS
  Filled 2011-07-30 (×9): qty 1000

## 2011-07-30 MED ORDER — SODIUM BICARBONATE 8.4 % IV SOLN
50.0000 meq | Freq: Once | INTRAVENOUS | Status: AC
  Administered 2011-07-30: 50 meq via INTRAVENOUS

## 2011-07-30 MED ORDER — SODIUM CHLORIDE 0.9 % IV SOLN
500.0000 mg | Freq: Two times a day (BID) | INTRAVENOUS | Status: DC
  Filled 2011-07-30 (×2): qty 5

## 2011-07-30 MED ORDER — SODIUM CHLORIDE 0.9 % IV SOLN
INTRAVENOUS | Status: DC
  Administered 2011-07-30: 1000 mL via INTRAVENOUS

## 2011-07-30 MED ORDER — LORAZEPAM 2 MG/ML IJ SOLN: 2.0000 mg | INTRAMUSCULAR | Status: DC | PRN

## 2011-07-30 MED ORDER — ACETAMINOPHEN 325 MG PO TABS
650.0000 mg | ORAL_TABLET | Freq: Four times a day (QID) | ORAL | Status: DC | PRN
  Administered 2011-07-30 – 2011-07-31 (×3): 650 mg via ORAL
  Filled 2011-07-30 (×3): qty 2

## 2011-07-30 MED ORDER — CHLORHEXIDINE GLUCONATE 0.12 % MT SOLN
15.0000 mL | Freq: Two times a day (BID) | OROMUCOSAL | Status: DC
  Administered 2011-07-30 – 2011-08-02 (×6): 15 mL via OROMUCOSAL
  Filled 2011-07-30 (×7): qty 15

## 2011-07-30 MED ORDER — LEVOFLOXACIN IN D5W 750 MG/150ML IV SOLN
750.0000 mg | INTRAVENOUS | Status: DC
  Administered 2011-07-30 – 2011-08-01 (×3): 750 mg via INTRAVENOUS
  Filled 2011-07-30 (×3): qty 150

## 2011-07-30 MED ORDER — PHENYTOIN SODIUM 50 MG/ML IJ SOLN
100.0000 mg | Freq: Three times a day (TID) | INTRAMUSCULAR | Status: DC
  Administered 2011-07-31 – 2011-08-02 (×7): 100 mg via INTRAVENOUS
  Filled 2011-07-30 (×13): qty 2

## 2011-07-30 MED ORDER — BIOTENE DRY MOUTH MT LIQD
15.0000 mL | Freq: Two times a day (BID) | OROMUCOSAL | Status: DC
  Administered 2011-07-31 – 2011-08-02 (×5): 15 mL via OROMUCOSAL

## 2011-07-30 MED ORDER — ACETAMINOPHEN 650 MG RE SUPP: 650.0000 mg | Freq: Four times a day (QID) | RECTAL | Status: DC | PRN

## 2011-07-30 MED ORDER — SODIUM CHLORIDE 0.9 % IV SOLN
1000.0000 mg | Freq: Once | INTRAVENOUS | Status: AC
  Administered 2011-07-30: 1000 mg via INTRAVENOUS
  Filled 2011-07-30: qty 20

## 2011-07-30 MED ORDER — ONDANSETRON HCL 4 MG/2ML IJ SOLN: 4.0000 mg | Freq: Four times a day (QID) | INTRAMUSCULAR | Status: DC | PRN

## 2011-07-30 MED ORDER — ALBUTEROL SULFATE (5 MG/ML) 0.5% IN NEBU
2.5000 mg | INHALATION_SOLUTION | RESPIRATORY_TRACT | Status: DC | PRN
  Administered 2011-07-31: 2.5 mg via RESPIRATORY_TRACT
  Filled 2011-07-30: qty 0.5

## 2011-07-30 MED ORDER — ENOXAPARIN SODIUM 40 MG/0.4ML ~~LOC~~ SOLN
40.0000 mg | SUBCUTANEOUS | Status: DC
  Administered 2011-07-30 – 2011-08-02 (×4): 40 mg via SUBCUTANEOUS
  Filled 2011-07-30 (×4): qty 0.4

## 2011-07-30 MED ORDER — ONDANSETRON HCL 4 MG PO TABS: 4.0000 mg | ORAL_TABLET | Freq: Four times a day (QID) | ORAL | Status: DC | PRN

## 2011-07-30 MED ORDER — SODIUM BICARBONATE 8.4 % IV SOLN
INTRAVENOUS | Status: AC
  Administered 2011-07-30: 50 meq via INTRAVENOUS
  Filled 2011-07-30: qty 50

## 2011-07-30 MED ORDER — SODIUM CHLORIDE 0.9 % IV SOLN
1000.0000 mg | Freq: Once | INTRAVENOUS | Status: AC
  Administered 2011-07-30: 1000 mg via INTRAVENOUS
  Filled 2011-07-30: qty 10

## 2011-07-30 MED ORDER — LORAZEPAM 2 MG/ML IJ SOLN
4.0000 mg | Freq: Once | INTRAMUSCULAR | Status: AC
  Administered 2011-07-30: 4 mg via INTRAVENOUS

## 2011-07-30 NOTE — ED Provider Notes (Addendum)
History     CSN: [id number]  Arrival date & time 07/30/11  7829   First MD Initiated Contact with Patient 07/30/11 [phone]      Chief Complaint  Patient presents with  . Altered Mental Status    (Consider location/radiation/quality/duration/timing/severity/associated sxs/prior treatment) HPI Level 5 Caveat: altered mental status. This is a 26 year old male with a history of seizure disorder. He was found unresponsive by his mother just prior to arrival. She brought him to the ED in her car. ED staff found him to be unresponsive with sonorous respirations. His tonic-clonic activity had ceased by this time. He was placed on oxygen and monitoring and I was summoned to the room. He was noted to be tachycardic and now has a normal oxygen saturation. On my arrival the patient was unresponsive and unable to give any history.  Past Medical History  Diagnosis Date  . Seizures     No past surgical history on file.  No family history on file.  History  Substance Use Topics  . Smoking status: Unknown If Ever Smoked  . Smokeless tobacco: Not on file  . Alcohol Use: Yes      Review of Systems  Unable to perform ROS   Allergies  Review of patient's allergies indicates no known allergies.  Home Medications   Current Outpatient Rx  Name Route Sig Dispense Refill  . PHENYTOIN SODIUM EXTENDED 200 MG PO CAPS Oral Take 1 capsule (200 mg total) by mouth daily. 60 capsule 0    BP 156/89  Pulse 119  Temp(Src) 100.3 F (37.9 C) (Core (Comment))  Resp 17  SpO2 100%  Physical Exam General: Well-developed, well-nourished male; appearance consistent with age of record HENT: normocephalic; type marks the tongue bilaterally; jaw clenched Eyes: pupils equal round and reactive to light; wandering, disconjugate gaze Neck: supple Heart: regular rate and rhythm; tachycardic Lungs: clear to auscultation bilaterally; breathing spontaneously; O2 saturation normal on NRB Abdomen: soft;  nondistended Extremities: No deformity; pulses normal Neurologic: Unresponsive  Skin: Warm and dry     ED Course  Procedures (including critical care time)  CRITICAL CARE Performed by: Desmin Daleo L   Total critical care time: 90  Critical care time was exclusive of separately billable procedures and treating other patients.  Critical care was necessary to treat or prevent imminent or life-threatening deterioration.  Critical care was time spent personally by me on the following activities: development of treatment plan with patient and/or surrogate as well as nursing, discussions with consultants, evaluation of patient's response to treatment, examination of patient, obtaining history from patient or surrogate, ordering and performing treatments and interventions, ordering and review of laboratory studies, ordering and review of radiographic studies, pulse oximetry and re-evaluation of patient's condition.    MDM   Nursing notes and vitals signs, including pulse oximetry, reviewed.  Summary of this visit's results, reviewed by myself:  Labs:  Results for orders placed during the hospital encounter of 07/30/11  BLOOD GAS, VENOUS      Component Value Range   pH, Ven 6.839 (*) 7.250 - 7.300    pCO2, Ven 46.7  45.0 - 50.0 (mmHg)   pO2, Ven 91.8 (*) 30.0 - 45.0 (mmHg)   Bicarbonate 7.5 (*) 20.0 - 24.0 (mEq/L)   TCO2 7.8  0 - 100 (mmol/L)   O2 Saturation 89.3     Patient temperature 98.6     Collection site VENOUS     Drawn by COLLECTED BY LABORATORY     Sample type  VENOUS    GLUCOSE, CAPILLARY      Component Value Range   Glucose-Capillary 207 (*) 70 - 99 (mg/dL)  CBC      Component Value Range   WBC 25.9 (*) 4.0 - 10.5 (K/uL)   RBC 5.76  4.22 - 5.81 (MIL/uL)   Hemoglobin 18.5 (*) 13.0 - 17.0 (g/dL)   HCT 40.9  81.1 - 91.4 (%)   MCV 87.5  78.0 - 100.0 (fL)   MCH 32.1  26.0 - 34.0 (pg)   MCHC 36.7 (*) 30.0 - 36.0 (g/dL)   RDW 78.2  95.6 - 21.3 (%)   Platelets 296   150 - 400 (K/uL)  DIFFERENTIAL      Component Value Range   Neutrophils Relative 34 (*) 43 - 77 (%)   Lymphocytes Relative 55 (*) 12 - 46 (%)   Monocytes Relative 7  3 - 12 (%)   Eosinophils Relative 3  0 - 5 (%)   Basophils Relative 1  0 - 1 (%)   Neutro Abs 8.8 (*) 1.7 - 7.7 (K/uL)   Lymphs Abs 14.2 (*) 0.7 - 4.0 (K/uL)   Monocytes Absolute 1.8 (*) 0.1 - 1.0 (K/uL)   Eosinophils Absolute 0.8 (*) 0.0 - 0.7 (K/uL)   Basophils Absolute 0.3 (*) 0.0 - 0.1 (K/uL)   RBC Morphology TEARDROP CELLS     WBC Morphology TOXIC GRANULATION    ETHANOL      Component Value Range   Alcohol, Ethyl (B) <11  0 - 11 (mg/dL)  URINE RAPID DRUG SCREEN (HOSP PERFORMED)      Component Value Range   Opiates NONE DETECTED  NONE DETECTED    Cocaine NONE DETECTED  NONE DETECTED    Benzodiazepines NONE DETECTED  NONE DETECTED    Amphetamines NONE DETECTED  NONE DETECTED    Tetrahydrocannabinol NONE DETECTED  NONE DETECTED    Barbiturates NONE DETECTED  NONE DETECTED   PHENYTOIN LEVEL, TOTAL      Component Value Range   Phenytoin Lvl <2.5 (*) 10.0 - 20.0 (ug/mL)  BLOOD GAS, VENOUS      Component Value Range   FIO2 1.00     Delivery systems NON-REBREATHER OXYGEN MASK     pH, Ven 7.219 (*) 7.250 - 7.300    pCO2, Ven 35.5 (*) 45.0 - 50.0 (mmHg)   pO2, Ven 209.0 (*) 30.0 - 45.0 (mmHg)   Bicarbonate 14.0 (*) 20.0 - 24.0 (mEq/L)   TCO2 12.6  0 - 100 (mmol/L)   Acid-base deficit 12.8 (*) 0.0 - 2.0 (mmol/L)   O2 Saturation 99.2     Patient temperature 98.6     Collection site VENOUS     Drawn by COLLECTED BY LABORATORY     Sample type VENOUS    BLOOD GAS, VENOUS      Component Value Range   O2 Content 5.0     Delivery systems SIMPLE MASK     pH, Ven 7.403 (*) 7.250 - 7.300    pCO2, Ven 40.3 (*) 45.0 - 50.0 (mmHg)   pO2, Ven 62.3 (*) 30.0 - 45.0 (mmHg)   Bicarbonate 24.6 (*) 20.0 - 24.0 (mEq/L)   TCO2 20.8  0 - 100 (mmol/L)   Acid-Base Excess 0.4  0.0 - 2.0 (mmol/L)   O2 Saturation 93.6     Patient  temperature 98.6     Collection site VENOUS     Drawn by COLLECTED BY LABORATORY     Sample type VENOUS    CK  Component Value Range   Total CK 475 (*) 7 - 232 (U/L)  SALICYLATE LEVEL      Component Value Range   Salicylate Lvl <2.0 (*) 2.8 - 20.0 (mg/dL)  CK      Component Value Range   Total CK 518 (*) 7 - 232 (U/L)  COMPREHENSIVE METABOLIC PANEL      Component Value Range   Sodium 136  135 - 145 (mEq/L)   Potassium 3.2 (*) 3.5 - 5.1 (mEq/L)   Chloride 102  96 - 112 (mEq/L)   CO2 23  19 - 32 (mEq/L)   Glucose, Bld 103 (*) 70 - 99 (mg/dL)   BUN 10  6 - 23 (mg/dL)   Creatinine, Ser 1.61  0.50 - 1.35 (mg/dL)   Calcium 7.9 (*) 8.4 - 10.5 (mg/dL)   Total Protein 7.6  6.0 - 8.3 (g/dL)   Albumin 3.8  3.5 - 5.2 (g/dL)   AST 24  0 - 37 (U/L)   ALT 12  0 - 53 (U/L)   Alkaline Phosphatase 92  39 - 117 (U/L)   Total Bilirubin 0.5  0.3 - 1.2 (mg/dL)   GFR calc non Af Amer >90  >90 (mL/min)   GFR calc Af Amer >90  >90 (mL/min)   Dg Chest 2 View  07/30/2011  *RADIOLOGY REPORT*  Clinical Data: Unresponsive.  History of seizures.  Question pneumonia.  CHEST - 2 VIEW  Comparison: Portable examination 05/30/2011.  Findings: There are low lung volumes.  Heart size and mediastinal contours are stable.  The lungs are clear.  There is no pleural effusion or pneumothorax.  Deformity of the sternum on the lateral view does not appear acute but is not imaged on the prior portable examinations.  No acute osseous findings are identified.  Multiple telemetry leads overlie the chest.  IMPRESSION:  1.  No evidence of pneumonia or other acute cardiopulmonary process. 2.  Suspected old mid sternal fracture.  Original Report Authenticated By: Gerrianne Scale, M.D.   Ct Head Wo Contrast  07/30/2011  *RADIOLOGY REPORT*  Clinical Data: History of seizure disorder.  Unresponsive.  CT HEAD WITHOUT CONTRAST  Technique:  Contiguous axial images were obtained from the base of the skull through the vertex without  contrast.  Comparison: Prior examinations 10/19/2010 and 07/04/2009.  Findings: Some images were repeated due to motion.  Cavum septum pellucidum and vergae are noted. There is no evidence of acute intracranial hemorrhage, mass lesion, brain edema or extra-axial fluid collection.  The ventricles and subarachnoid spaces are appropriately sized for age.  There is no CT evidence of acute cortical infarction.  Right sphenoid sinus mucosal thickening is demonstrated.  The visualized sinuses are otherwise clear. The calvarium is intact.  IMPRESSION: No acute intracranial findings.  Right sphenoid sinus mucosal thickening.  Original Report Authenticated By: Gerrianne Scale, M.D.     EKG Interpretation:  Date & Time: 07/30/2011 4:09 AM  Rate: 115  Rhythm: sinus tachycardia  QRS Axis: normal  Intervals: normal  ST/T Wave abnormalities: Nonspecific ST changes  Conduction Disutrbances:none  Narrative Interpretation:   Old EKG Reviewed: unchanged  4:38 AM As attempts are being made to intubate the patient he was first given 4 mg of Ativan IV to terminate any remaining seizure activity.. End plate the patient began gagging, moving his jaw and became combative. It was decided not to intubate at this time. His venous blood gas showed a metabolic acidosis with a pH of 6.84. He was given  1 a bicarbonate IV. Her sugar was noted to be 207. There was no alcohol or ketones detected on his breath. Past records indicate that he has been on Dilantin we will check a level.  5:00 AM Patient still with altered level of consciousness but more responsive to stimuli. Blood gas shows pH is improved to 7.22.  5:59 AM Awaiting phenytoin level.  6:30 AM Patient now awakens to sternal rub. He is nonverbal but it is unclear if there is a language barrier.  6:59 AM Blood gas now normal. She still responds to sternal rub but falls back to sleep when left alone. Temperature noted to be 100.  7:32 AM [facility] to  admit patient to step down. The patient's initial metabolic acidosis raises concern for significant period of anoxia prior to arrival in the ED. It is unknown how long the patient seized he was able to maintain a patent airway during his unwitnessed seizure. The patient's oxygenation as noted above was rapidly corrected by nursing staff when he arrived in the resuscitation room. His metabolic acidosis resolved once treatment was established.         Hanley Seamen, MD 07/30/11 1610  Hanley Seamen, MD 07/30/11 9604  Hanley Seamen, MD 07/30/11 1015

## 2011-07-30 NOTE — H&P (Addendum)
Douglas Rivas MRN: [id number] DOB/AGE: 1986/02/07 26 y.o. Primary Care Physician:No primary provider on file. Admit date: 07/30/2011 Chief Complaint: Altered mental status  HPIThis is a 26 year old male with a history of seizure disorder. He was found unresponsive by his mother just prior to arrival. She brought him to the ED in her car. ED staff found him to be unresponsive with sonorous respirations. His tonic-clonic activity had ceased by this time. Initially he was found to have severe metabolic acidosis.  On arrival to the ER, the patient was unresponsive and unable to give any history. History was provided by the mother. The patient had evidence of status epilepticus with tongue biting, stool and urinary incontinence. He was also found to be febrile with a temperature of 100.1. No preceding fever chills rigors headache blurry vision prior to the onset of seizures.   Past Medical History  Diagnosis Date  . Seizures     No past surgical history on file.  Prior to Admission medications   Medication Sig Start Date End Date Taking? Authorizing Provider  phenytoin (DILANTIN) 200 MG ER capsule Take 1 capsule (200 mg total) by mouth daily. 05/30/11 05/29/12 Yes Forbes Cellar, MD    Allergies: No Known Allergies  No family history on file.  Social History:  has an unknown smoking status. He does not have any smokeless tobacco history on file. He reports that he drinks alcohol. His drug history not on file.         ROS: Complete 14 point review of systems was done as documented in history of present illness PHYSICAL EXAM: Blood pressure 156/89, pulse 119, temperature 100.3 F (37.9 C), temperature source Core (Comment), resp. rate 17, SpO2 100.00%. General: Well-developed, well-nourished male; appearance consistent with age of record  HENT: normocephalic; type marks the tongue bilaterally; jaw clenched  Eyes: pupils equal round and reactive to light; wandering, disconjugate  gaze  Neck: supple  Heart: regular rate and rhythm; tachycardic  Lungs: clear to auscultation bilaterally; breathing spontaneously; O2 saturation normal on NRB  Abdomen: soft; nondistended  Extremities: No deformity; pulses normal  Neurologic: Unresponsive initially, otherwise no gross focal neurologic deficits moving all 4 extremities spontaneously Skin: Warm and dry      No results found for this or any previous visit (from the past 240 hour(s)).   Results for orders placed during the hospital encounter of 07/30/11 (from the past 48 hour(s))  CBC     Status: Abnormal   Collection Time   07/30/11  4:15 AM      Component Value Range Comment   WBC 25.9 (*) 4.0 - 10.5 (K/uL) REPEATED TO VERIFY   RBC 5.76  4.22 - 5.81 (MIL/uL)    Hemoglobin 18.5 (*) 13.0 - 17.0 (g/dL)    HCT 40.9  81.1 - 91.4 (%)    MCV 87.5  78.0 - 100.0 (fL)    MCH 32.1  26.0 - 34.0 (pg)    MCHC 36.7 (*) 30.0 - 36.0 (g/dL)    RDW 78.2  95.6 - 21.3 (%)    Platelets 296  150 - 400 (K/uL)   DIFFERENTIAL     Status: Abnormal   Collection Time   07/30/11  4:15 AM      Component Value Range Comment   Neutrophils Relative 34 (*) 43 - 77 (%)    Lymphocytes Relative 55 (*) 12 - 46 (%)    Monocytes Relative 7  3 - 12 (%)    Eosinophils Relative 3  0 -  5 (%)    Basophils Relative 1  0 - 1 (%)    Neutro Abs 8.8 (*) 1.7 - 7.7 (K/uL)    Lymphs Abs 14.2 (*) 0.7 - 4.0 (K/uL)    Monocytes Absolute 1.8 (*) 0.1 - 1.0 (K/uL)    Eosinophils Absolute 0.8 (*) 0.0 - 0.7 (K/uL)    Basophils Absolute 0.3 (*) 0.0 - 0.1 (K/uL)    RBC Morphology TEARDROP CELLS      WBC Morphology TOXIC GRANULATION   ATYPICAL LYMPHOCYTES  ETHANOL     Status: Normal   Collection Time   07/30/11  4:15 AM      Component Value Range Comment   Alcohol, Ethyl (B) <11  0 - 11 (mg/dL)   PHENYTOIN LEVEL, TOTAL     Status: Abnormal   Collection Time   07/30/11  4:15 AM      Component Value Range Comment   Phenytoin Lvl <2.5 (*) 10.0 - 20.0 (ug/mL)     GLUCOSE, CAPILLARY     Status: Abnormal   Collection Time   07/30/11  4:21 AM      Component Value Range Comment   Glucose-Capillary 207 (*) 70 - 99 (mg/dL)   BLOOD GAS, VENOUS     Status: Abnormal   Collection Time   07/30/11  4:22 AM      Component Value Range Comment   pH, Ven 6.839 (*) 7.250 - 7.300     pCO2, Ven 46.7  45.0 - 50.0 (mmHg)    pO2, Ven 91.8 (*) 30.0 - 45.0 (mmHg)    Bicarbonate 7.5 (*) 20.0 - 24.0 (mEq/L)    TCO2 7.8  0 - 100 (mmol/L)    O2 Saturation 89.3      Patient temperature 98.6      Collection site VENOUS      Drawn by COLLECTED BY LABORATORY      Sample type VENOUS     URINE RAPID DRUG SCREEN (HOSP PERFORMED)     Status: Normal   Collection Time   07/30/11  4:43 AM      Component Value Range Comment   Opiates NONE DETECTED  NONE DETECTED     Cocaine NONE DETECTED  NONE DETECTED     Benzodiazepines NONE DETECTED  NONE DETECTED     Amphetamines NONE DETECTED  NONE DETECTED     Tetrahydrocannabinol NONE DETECTED  NONE DETECTED     Barbiturates NONE DETECTED  NONE DETECTED    BLOOD GAS, VENOUS     Status: Abnormal   Collection Time   07/30/11  4:45 AM      Component Value Range Comment   FIO2 1.00      Delivery systems NON-REBREATHER OXYGEN MASK      pH, Ven 7.219 (*) 7.250 - 7.300     pCO2, Ven 35.5 (*) 45.0 - 50.0 (mmHg)    pO2, Ven 209.0 (*) 30.0 - 45.0 (mmHg)    Bicarbonate 14.0 (*) 20.0 - 24.0 (mEq/L)    TCO2 12.6  0 - 100 (mmol/L)    Acid-base deficit 12.8 (*) 0.0 - 2.0 (mmol/L)    O2 Saturation 99.2      Patient temperature 98.6      Collection site VENOUS      Drawn by COLLECTED BY LABORATORY      Sample type VENOUS     BLOOD GAS, VENOUS     Status: Abnormal   Collection Time   07/30/11  6:48 AM  Component Value Range Comment   O2 Content 5.0      Delivery systems SIMPLE MASK      pH, Ven 7.403 (*) 7.250 - 7.300     pCO2, Ven 40.3 (*) 45.0 - 50.0 (mmHg)    pO2, Ven 62.3 (*) 30.0 - 45.0 (mmHg)    Bicarbonate 24.6 (*) 20.0 - 24.0  (mEq/L)    TCO2 20.8  0 - 100 (mmol/L)    Acid-Base Excess 0.4  0.0 - 2.0 (mmol/L)    O2 Saturation 93.6      Patient temperature 98.6      Collection site VENOUS      Drawn by COLLECTED BY LABORATORY      Sample type VENOUS     CK     Status: Abnormal   Collection Time   07/30/11  7:30 AM      Component Value Range Comment   Total CK 475 (*) 7 - 232 (U/L)   SALICYLATE LEVEL     Status: Abnormal   Collection Time   07/30/11  8:20 AM      Component Value Range Comment   Salicylate Lvl <2.0 (*) 2.8 - 20.0 (mg/dL)   CK     Status: Abnormal   Collection Time   07/30/11  8:20 AM      Component Value Range Comment   Total CK 518 (*) 7 - 232 (U/L)   COMPREHENSIVE METABOLIC PANEL     Status: Abnormal   Collection Time   07/30/11  8:20 AM      Component Value Range Comment   Sodium 136  135 - 145 (mEq/L)    Potassium 3.2 (*) 3.5 - 5.1 (mEq/L)    Chloride 102  96 - 112 (mEq/L)    CO2 23  19 - 32 (mEq/L)    Glucose, Bld 103 (*) 70 - 99 (mg/dL)    BUN 10  6 - 23 (mg/dL)    Creatinine, Ser 1.61  0.50 - 1.35 (mg/dL)    Calcium 7.9 (*) 8.4 - 10.5 (mg/dL)    Total Protein 7.6  6.0 - 8.3 (g/dL)    Albumin 3.8  3.5 - 5.2 (g/dL)    AST 24  0 - 37 (U/L)    ALT 12  0 - 53 (U/L)    Alkaline Phosphatase 92  39 - 117 (U/L)    Total Bilirubin 0.5  0.3 - 1.2 (mg/dL)    GFR calc non Af Amer >90  >90 (mL/min)    GFR calc Af Amer >90  >90 (mL/min)     Dg Chest 2 View  07/30/2011  *RADIOLOGY REPORT*  Clinical Data: Unresponsive.  History of seizures.  Question pneumonia.  CHEST - 2 VIEW  Comparison: Portable examination 05/30/2011.  Findings: There are low lung volumes.  Heart size and mediastinal contours are stable.  The lungs are clear.  There is no pleural effusion or pneumothorax.  Deformity of the sternum on the lateral view does not appear acute but is not imaged on the prior portable examinations.  No acute osseous findings are identified.  Multiple telemetry leads overlie the chest.  IMPRESSION:   1.  No evidence of pneumonia or other acute cardiopulmonary process. 2.  Suspected old mid sternal fracture.  Original Report Authenticated By: Gerrianne Scale, M.D.   Ct Head Wo Contrast  07/30/2011  *RADIOLOGY REPORT*  Clinical Data: History of seizure disorder.  Unresponsive.  CT HEAD WITHOUT CONTRAST  Technique:  Contiguous axial images were  obtained from the base of the skull through the vertex without contrast.  Comparison: Prior examinations 10/19/2010 and 07/04/2009.  Findings: Some images were repeated due to motion.  Cavum septum pellucidum and vergae are noted. There is no evidence of acute intracranial hemorrhage, mass lesion, brain edema or extra-axial fluid collection.  The ventricles and subarachnoid spaces are appropriately sized for age.  There is no CT evidence of acute cortical infarction.  Right sphenoid sinus mucosal thickening is demonstrated.  The visualized sinuses are otherwise clear. The calvarium is intact.  IMPRESSION: No acute intracranial findings.  Right sphenoid sinus mucosal thickening.  Original Report Authenticated By: Gerrianne Scale, M.D.    Impression: #1 status epilepticus. The patient's CT scan is essentially normal, his elevated CKs consistent with this status epilepticus. Altered mental status is because of postictal delirium. He is clinically not improved. His Dilantin level is subtherapeutic. His urine drug screen EtOH and salicylate level is negative. Given his fever meningitis would be in the differential, however at this time his neurologic exam as well as history does not suggest the possibility of meningitis. Therefore we'll defer a lumbar puncture at this point. The patient has been loaded with Keppra 1 g IV. Will continue with Keppra. Most likely the patient's Dilantin needs to be adjusted there may be a medication compliance issue.  #2 leukocytosis could be secondary to stress margination versus secondary to aspiration. We'll start him empirically on  Levaquin.  #3 hypokalemia replete    Endiya Klahr 07/30/2011, 9:15 AM

## 2011-07-30 NOTE — ED Notes (Signed)
Family is not able to be reach.  Mother has left the facility

## 2011-07-30 NOTE — Consult Note (Addendum)
Reason for Consult:"seizures"  HPI: Douglas Rivas is an 26 y.o. male. With history of epilepsy who was found by mother unresponsive this am. He has subsequently been post-ictal and confused. He has no insurance. He was brought to ED 3 months ago and started on Dilantin, but it is not certain that he followed up with neurology or if he was taking Dilantin. Communication with patient and family is difficult due to language barrier - however, beyond the above mentioned, family could not provide further information. Patient denies using drugs. Dilantin level in ED was less than 2.5. Utox negative.   Past Medical History  Diagnosis Date  . Seizures    Medications: I have reviewed the patient's current medications.  No past surgical history on file.  No family history on file.  Social History:  has an unknown smoking status. He does not have any smokeless tobacco history on file. He reports that he drinks alcohol. His drug history not on file.  Allergies: No Known Allergies  ROS: as above  Blood pressure 156/89, pulse 119, temperature 100.3 F (37.9 C), temperature source Core (Comment), resp. rate 17, SpO2 100.00%.  Neurological exam: Psychomotor retardation, but not obviously aphasic. Followed complex commands. AAO*2 and to day of week, month, but not date. Could not tell me month of the year or calculate.Cranial nerves: EOMI, PERRL. Visual fields were full. Sensation to V1 through V3 areas of the face was intact and symmetric throughout. There was no facial asymmetry. Hearing to finger rub was equal and symmetrical bilaterally. Shoulder shrug was 5/5 and symmetric bilaterally. Head rotation was 5/5 bilaterally. There was no dysarthria or palatal deviation.Motor: strength was 5/5 and symmetric throughout except proximal left arm which was pain-limited. Sensory: was intact throughout to light touch, pinprick Coordination: finger-to-nose were intact on right, could not assess left due to shoulder  pain and difficulty elevating arm. Reflexes: were 2+ in upper extremities and 1+ at the knees and 1+ at the ankles. Plantar response was downgoing bilaterally. Gait: deferred  Results for orders placed during the hospital encounter of 07/30/11 (from the past 48 hour(s))  CBC     Status: Abnormal   Collection Time   07/30/11  4:15 AM      Component Value Range Comment   WBC 25.9 (*) 4.0 - 10.5 (K/uL) REPEATED TO VERIFY   RBC 5.76  4.22 - 5.81 (MIL/uL)    Hemoglobin 18.5 (*) 13.0 - 17.0 (g/dL)    HCT 84.6  96.2 - 95.2 (%)    MCV 87.5  78.0 - 100.0 (fL)    MCH 32.1  26.0 - 34.0 (pg)    MCHC 36.7 (*) 30.0 - 36.0 (g/dL)    RDW 84.1  32.4 - 40.1 (%)    Platelets 296  150 - 400 (K/uL)   DIFFERENTIAL     Status: Abnormal   Collection Time   07/30/11  4:15 AM      Component Value Range Comment   Neutrophils Relative 34 (*) 43 - 77 (%)    Lymphocytes Relative 55 (*) 12 - 46 (%)    Monocytes Relative 7  3 - 12 (%)    Eosinophils Relative 3  0 - 5 (%)    Basophils Relative 1  0 - 1 (%)    Neutro Abs 8.8 (*) 1.7 - 7.7 (K/uL)    Lymphs Abs 14.2 (*) 0.7 - 4.0 (K/uL)    Monocytes Absolute 1.8 (*) 0.1 - 1.0 (K/uL)    Eosinophils Absolute 0.8 (*)  0.0 - 0.7 (K/uL)    Basophils Absolute 0.3 (*) 0.0 - 0.1 (K/uL)    RBC Morphology TEARDROP CELLS      WBC Morphology TOXIC GRANULATION   ATYPICAL LYMPHOCYTES  ETHANOL     Status: Normal   Collection Time   07/30/11  4:15 AM      Component Value Range Comment   Alcohol, Ethyl (B) <11  0 - 11 (mg/dL)   PHENYTOIN LEVEL, TOTAL     Status: Abnormal   Collection Time   07/30/11  4:15 AM      Component Value Range Comment   Phenytoin Lvl <2.5 (*) 10.0 - 20.0 (ug/mL)   GLUCOSE, CAPILLARY     Status: Abnormal   Collection Time   07/30/11  4:21 AM      Component Value Range Comment   Glucose-Capillary 207 (*) 70 - 99 (mg/dL)   BLOOD GAS, VENOUS     Status: Abnormal   Collection Time   07/30/11  4:22 AM      Component Value Range Comment   pH, Ven 6.839 (*)  7.250 - 7.300     pCO2, Ven 46.7  45.0 - 50.0 (mmHg)    pO2, Ven 91.8 (*) 30.0 - 45.0 (mmHg)    Bicarbonate 7.5 (*) 20.0 - 24.0 (mEq/L)    TCO2 7.8  0 - 100 (mmol/L)    O2 Saturation 89.3      Patient temperature 98.6      Collection site VENOUS      Drawn by COLLECTED BY LABORATORY      Sample type VENOUS     URINE RAPID DRUG SCREEN (HOSP PERFORMED)     Status: Normal   Collection Time   07/30/11  4:43 AM      Component Value Range Comment   Opiates NONE DETECTED  NONE DETECTED     Cocaine NONE DETECTED  NONE DETECTED     Benzodiazepines NONE DETECTED  NONE DETECTED     Amphetamines NONE DETECTED  NONE DETECTED     Tetrahydrocannabinol NONE DETECTED  NONE DETECTED     Barbiturates NONE DETECTED  NONE DETECTED    BLOOD GAS, VENOUS     Status: Abnormal   Collection Time   07/30/11  4:45 AM      Component Value Range Comment   FIO2 1.00      Delivery systems NON-REBREATHER OXYGEN MASK      pH, Ven 7.219 (*) 7.250 - 7.300     pCO2, Ven 35.5 (*) 45.0 - 50.0 (mmHg)    pO2, Ven 209.0 (*) 30.0 - 45.0 (mmHg)    Bicarbonate 14.0 (*) 20.0 - 24.0 (mEq/L)    TCO2 12.6  0 - 100 (mmol/L)    Acid-base deficit 12.8 (*) 0.0 - 2.0 (mmol/L)    O2 Saturation 99.2      Patient temperature 98.6      Collection site VENOUS      Drawn by COLLECTED BY LABORATORY      Sample type VENOUS     BLOOD GAS, VENOUS     Status: Abnormal   Collection Time   07/30/11  6:48 AM      Component Value Range Comment   O2 Content 5.0      Delivery systems SIMPLE MASK      pH, Ven 7.403 (*) 7.250 - 7.300     pCO2, Ven 40.3 (*) 45.0 - 50.0 (mmHg)    pO2, Ven 62.3 (*) 30.0 - 45.0 (mmHg)  Bicarbonate 24.6 (*) 20.0 - 24.0 (mEq/L)    TCO2 20.8  0 - 100 (mmol/L)    Acid-Base Excess 0.4  0.0 - 2.0 (mmol/L)    O2 Saturation 93.6      Patient temperature 98.6      Collection site VENOUS      Drawn by COLLECTED BY LABORATORY      Sample type VENOUS     CK     Status: Abnormal   Collection Time   07/30/11  7:30 AM       Component Value Range Comment   Total CK 475 (*) 7 - 232 (U/L)   SALICYLATE LEVEL     Status: Abnormal   Collection Time   07/30/11  8:20 AM      Component Value Range Comment   Salicylate Lvl <2.0 (*) 2.8 - 20.0 (mg/dL)   CK     Status: Abnormal   Collection Time   07/30/11  8:20 AM      Component Value Range Comment   Total CK 518 (*) 7 - 232 (U/L)   COMPREHENSIVE METABOLIC PANEL     Status: Abnormal   Collection Time   07/30/11  8:20 AM      Component Value Range Comment   Sodium 136  135 - 145 (mEq/L)    Potassium 3.2 (*) 3.5 - 5.1 (mEq/L)    Chloride 102  96 - 112 (mEq/L)    CO2 23  19 - 32 (mEq/L)    Glucose, Bld 103 (*) 70 - 99 (mg/dL)    BUN 10  6 - 23 (mg/dL)    Creatinine, Ser 3.08  0.50 - 1.35 (mg/dL)    Calcium 7.9 (*) 8.4 - 10.5 (mg/dL)    Total Protein 7.6  6.0 - 8.3 (g/dL)    Albumin 3.8  3.5 - 5.2 (g/dL)    AST 24  0 - 37 (U/L)    ALT 12  0 - 53 (U/L)    Alkaline Phosphatase 92  39 - 117 (U/L)    Total Bilirubin 0.5  0.3 - 1.2 (mg/dL)    GFR calc non Af Amer >90  >90 (mL/min)    GFR calc Af Amer >90  >90 (mL/min)   URINALYSIS, ROUTINE W REFLEX MICROSCOPIC     Status: Abnormal   Collection Time   07/30/11 11:26 AM      Component Value Range Comment   Color, Urine YELLOW  YELLOW     APPearance CLEAR  CLEAR     Specific Gravity, Urine 1.014  1.005 - 1.030     pH 8.5 (*) 5.0 - 8.0     Glucose, UA NEGATIVE  NEGATIVE (mg/dL)    Hgb urine dipstick NEGATIVE  NEGATIVE     Bilirubin Urine NEGATIVE  NEGATIVE     Ketones, ur NEGATIVE  NEGATIVE (mg/dL)    Protein, ur NEGATIVE  NEGATIVE (mg/dL)    Urobilinogen, UA 0.2  0.0 - 1.0 (mg/dL)    Nitrite NEGATIVE  NEGATIVE     Leukocytes, UA NEGATIVE  NEGATIVE  MICROSCOPIC NOT DONE ON URINES WITH NEGATIVE PROTEIN, BLOOD, LEUKOCYTES, NITRITE, OR GLUCOSE <1000 mg/dL.   Dg Chest 2 View  07/30/2011  *RADIOLOGY REPORT*  Clinical Data: Unresponsive.  History of seizures.  Question pneumonia.  CHEST - 2 VIEW  Comparison: Portable  examination 05/30/2011.  Findings: There are low lung volumes.  Heart size and mediastinal contours are stable.  The lungs are clear.  There is no pleural effusion  or pneumothorax.  Deformity of the sternum on the lateral view does not appear acute but is not imaged on the prior portable examinations.  No acute osseous findings are identified.  Multiple telemetry leads overlie the chest.  IMPRESSION:  1.  No evidence of pneumonia or other acute cardiopulmonary process. 2.  Suspected old mid sternal fracture.  Original Report Authenticated By: Gerrianne Scale, M.D.   Ct Head Wo Contrast  07/30/2011  *RADIOLOGY REPORT*  Clinical Data: History of seizure disorder.  Unresponsive.  CT HEAD WITHOUT CONTRAST  Technique:  Contiguous axial images were obtained from the base of the skull through the vertex without contrast.  Comparison: Prior examinations 10/19/2010 and 07/04/2009.  Findings: Some images were repeated due to motion.  Cavum septum pellucidum and vergae are noted. There is no evidence of acute intracranial hemorrhage, mass lesion, brain edema or extra-axial fluid collection.  The ventricles and subarachnoid spaces are appropriately sized for age.  There is no CT evidence of acute cortical infarction.  Right sphenoid sinus mucosal thickening is demonstrated.  The visualized sinuses are otherwise clear. The calvarium is intact.  IMPRESSION: No acute intracranial findings.  Right sphenoid sinus mucosal thickening.  Original Report Authenticated By: Gerrianne Scale, M.D.   Assessment/Plan: 26 years old man with epilepsy and non-compliant with treatment possibly due to a combination of factors that includes no heath insurance and language barrier  1) MRI brain to rule out structural lesion 2) Social worker consult 3) Load with Dilantin and stop Keppra 4) No EEG as epilepsy diagnosis is certain at this point - if he remains confused, may consider EEG on Monday 5) We will follow up on Monday. Call if  questions before this time.  6) X-ray of left shoulder  Nathan Moctezuma 07/30/2011, 12:12 PM

## 2011-07-30 NOTE — ED Notes (Signed)
Nasal trumpet placed into right nare

## 2011-07-31 ENCOUNTER — Encounter (HOSPITAL_COMMUNITY): Payer: Self-pay | Admitting: *Deleted

## 2011-07-31 ENCOUNTER — Inpatient Hospital Stay (HOSPITAL_COMMUNITY): Payer: Self-pay

## 2011-07-31 LAB — COMPREHENSIVE METABOLIC PANEL
ALT: 10 U/L (ref 0–53)
Alkaline Phosphatase: 91 U/L (ref 39–117)
CO2: 23 mEq/L (ref 19–32)
Calcium: 8.6 mg/dL (ref 8.4–10.5)
Chloride: 107 mEq/L (ref 96–112)
GFR calc Af Amer: >90 mL/min (ref >90)
GFR calc non Af Amer: 89 mL/min — ABNORMAL LOW (ref >90)
Glucose, Bld: 69 mg/dL — ABNORMAL LOW (ref 70–99)
Potassium: 4.2 mEq/L (ref 3.5–5.1)
Sodium: 138 mEq/L (ref 135–145)
Total Bilirubin: 0.7 mg/dL (ref 0.3–1.2)

## 2011-07-31 LAB — CBC
MCH: 31.5 pg (ref 26.0–34.0)
MCHC: 37.6 g/dL — ABNORMAL HIGH (ref 30.0–36.0)
Platelets: 209 10*3/uL (ref 150–400)
RDW: 13.3 % (ref 11.5–15.5)

## 2011-07-31 LAB — CARDIAC PANEL(CRET KIN+CKTOT+MB+TROPI)
CK, MB: 3.2 ng/mL (ref 0.3–4.0)
Total CK: 911 U/L — ABNORMAL HIGH (ref 7–232)

## 2011-07-31 LAB — PHENYTOIN LEVEL, TOTAL: Phenytoin Lvl: 22.5 ug/mL — ABNORMAL HIGH (ref 10.0–20.0)

## 2011-07-31 MED ORDER — GADOBENATE DIMEGLUMINE 529 MG/ML IV SOLN
10.0000 mL | Freq: Once | INTRAVENOUS | Status: AC | PRN
  Administered 2011-07-31: 10 mL via INTRAVENOUS

## 2011-07-31 NOTE — Progress Notes (Signed)
Per conversation with patient he has been in the [place] for 11 years. He was born [place]. 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 He speaks english and does not require an interpreter.  He alternates residents between his mother's / stepdad's home and his brother's home.   Currently he is a Consulting civil engineer at [employer].   His left shoulder pain is from a year old injury he acquired while playing basketball. He has a sling but is not compliant with its use. Tylenol and alternate ice/heat helps with pain.

## 2011-07-31 NOTE — Progress Notes (Signed)
Subjective: Somewhat more alert but still not communicative  Objective: Vital signs in last 24 hours: Filed Vitals:   07/31/11 0000 07/31/11 0232 07/31/11 0650 07/31/11 0751  BP: 125/68  122/77   Pulse: 95  74   Temp: 99 F (37.2 C)  98.1 F (36.7 C)   TempSrc:      Resp: 23  26   Height:      Weight: 47.7 kg (105 lb 2.6 oz)     SpO2: 99% 98% 99% 98%    Intake/Output Summary (Last 24 hours) at 07/31/11 0833 Last data filed at 07/31/11 0600  Gross per 24 hour  Intake   1770 ml  Output   3015 ml  Net  -1245 ml    Weight change:   HENT: normocephalic; type marks the tongue bilaterally; jaw clenched  Eyes: pupils equal round and reactive to light; wandering, disconjugate gaze  Neck: supple  Heart: regular rate and rhythm; tachycardic  Lungs: clear to auscultation bilaterally; breathing spontaneously; O2 saturation normal on NRB  Abdomen: soft; nondistended  Extremities: No deformity; pulses normal  Neurologic: More alert, otherwise no gross focal neurologic deficits moving all 4 extremities spontaneously  Skin: Warm and dry    Lab Results: @LABTEST @  Micro: Recent Results (from the past 240 hour(s))  MRSA PCR SCREENING     Status: Normal   Collection Time   07/30/11 11:24 AM      Component Value Range Status Comment   MRSA by PCR NEGATIVE  NEGATIVE  Final     Studies/Results: Dg Chest 2 View  07/30/2011  *RADIOLOGY REPORT*  Clinical Data: Unresponsive.  History of seizures.  Question pneumonia.  CHEST - 2 VIEW  Comparison: Portable examination 05/30/2011.  Findings: There are low lung volumes.  Heart size and mediastinal contours are stable.  The lungs are clear.  There is no pleural effusion or pneumothorax.  Deformity of the sternum on the lateral view does not appear acute but is not imaged on the prior portable examinations.  No acute osseous findings are identified.  Multiple telemetry leads overlie the chest.  IMPRESSION:  1.  No evidence of pneumonia or other  acute cardiopulmonary process. 2.  Suspected old mid sternal fracture.  Original Report Authenticated By: Gerrianne Scale, M.D.   Ct Head Wo Contrast  07/30/2011  *RADIOLOGY REPORT*  Clinical Data: History of seizure disorder.  Unresponsive.  CT HEAD WITHOUT CONTRAST  Technique:  Contiguous axial images were obtained from the base of the skull through the vertex without contrast.  Comparison: Prior examinations 10/19/2010 and 07/04/2009.  Findings: Some images were repeated due to motion.  Cavum septum pellucidum and vergae are noted. There is no evidence of acute intracranial hemorrhage, mass lesion, brain edema or extra-axial fluid collection.  The ventricles and subarachnoid spaces are appropriately sized for age.  There is no CT evidence of acute cortical infarction.  Right sphenoid sinus mucosal thickening is demonstrated.  The visualized sinuses are otherwise clear. The calvarium is intact.  IMPRESSION: No acute intracranial findings.  Right sphenoid sinus mucosal thickening.  Original Report Authenticated By: Gerrianne Scale, M.D.   Dg Shoulder Left  07/30/2011  *RADIOLOGY REPORT*  Clinical Data: Pain, limited range of motion  LEFT SHOULDER - 2+ VIEW  Comparison: 11/16/2010  Findings: No fracture or dislocation.  AC joint distance is normal. Humeral head deformity could represent remote Hill-Sachs deformity. Left lung apex is clear.  IMPRESSION: No acute left humeral fracture or dislocation.  Original Report  Authenticated By: Harrel Lemon, M.D.    Medications:  Scheduled Meds:   . antiseptic oral rinse  15 mL Mouth Rinse q12n4p  . chlorhexidine  15 mL Mouth Rinse BID  . enoxaparin  40 mg Subcutaneous Q24H  . ipratropium  0.5 mg Nebulization Q6H  . levetiracetam  1,000 mg Intravenous Once  . levofloxacin (LEVAQUIN) IV  750 mg Intravenous Q24H  . phenytoin (DILANTIN) IV  1,000 mg Intravenous Once  . phenytoin (DILANTIN) IV  100 mg Intravenous Q8H  . DISCONTD: levetiracetam  500 mg  Intravenous Q12H   Continuous Infusions:   . 0.9 % NaCl with KCl 40 mEq / L 75 mL/hr at 07/31/11 0121  . DISCONTD: sodium chloride 1,000 mL (07/30/11 0429)   PRN Meds:.acetaminophen, acetaminophen, albuterol, LORazepam, ondansetron (ZOFRAN) IV, ondansetron   Assessment:   #1 status epilepticus. The patient's CT scan is essentially normal, his elevated CKs consistent with this status epilepticus. Altered mental status is because of postictal delirium. He is clinically not improved. His Dilantin level is subtherapeutic. His urine drug screen EtOH and salicylate level is negative. Given his fever meningitis would be in the differential, however at this time his neurologic exam as well as history does not suggest the possibility of meningitis. Therefore we'll defer a lumbar puncture at this point.  Dilantin has been restarted we'll check a Dilantin level in the morning, MRI is pending  . #2 leukocytosis improved , could be secondary to stress margination versus secondary to aspiration. We'll start him empirically on Levaquin.    #3 hypokalemia resolved   #4 disposition transfer out of stepdown    LOS: 1 day   [facility] 07/31/2011, 8:33 AM

## 2011-08-01 ENCOUNTER — Other Ambulatory Visit (HOSPITAL_COMMUNITY): Payer: Self-pay

## 2011-08-01 LAB — BASIC METABOLIC PANEL
BUN: 10 mg/dL (ref 6–23)
Creatinine, Ser: 1.07 mg/dL (ref 0.50–1.35)
GFR calc Af Amer: >90 mL/min (ref >90)
GFR calc non Af Amer: >90 mL/min (ref >90)
Glucose, Bld: 62 mg/dL — ABNORMAL LOW (ref 70–99)

## 2011-08-01 LAB — CBC
HCT: 42.5 % (ref 39.0–52.0)
Hemoglobin: 16 g/dL (ref 13.0–17.0)
MCHC: 37.4 g/dL — ABNORMAL HIGH (ref 30.0–36.0)
MCV: 84.3 fL (ref 78.0–100.0)
RDW: 13.4 % (ref 11.5–15.5)

## 2011-08-01 LAB — PATHOLOGIST SMEAR REVIEW

## 2011-08-01 MED ORDER — LEVOFLOXACIN 500 MG PO TABS
500.0000 mg | ORAL_TABLET | Freq: Every day | ORAL | Status: DC
  Administered 2011-08-02: 500 mg via ORAL
  Filled 2011-08-01: qty 1

## 2011-08-01 MED ORDER — SODIUM CHLORIDE 0.9 % IV SOLN
INTRAVENOUS | Status: DC
  Administered 2011-08-01: 21:00:00 via INTRAVENOUS
  Administered 2011-08-01: 100 mL/h via INTRAVENOUS
  Administered 2011-08-02: 06:00:00 via INTRAVENOUS

## 2011-08-01 MED ORDER — SODIUM CHLORIDE 0.9 % IV SOLN: 1000.0000 mg | Freq: Once | INTRAVENOUS | Status: DC

## 2011-08-01 NOTE — Progress Notes (Signed)
Subjective: More awake, interactive,  Objective: Vital signs in last 24 hours: Filed Vitals:   07/31/11 1400 07/31/11 1430 07/31/11 2122 08/01/11 0540  BP: 130/77 143/86 122/56 130/75  Pulse: 85 84 84 78  Temp:  98.3 F (36.8 C) 99 F (37.2 C) 98.6 F (37 C)  TempSrc:  Axillary Oral Oral  Resp: 21 18 16 16   Height:  5\' 6"  (1.676 m)    Weight:  48 kg (105 lb 13.1 oz)    SpO2: 98% 99% 98% 99%    Intake/Output Summary (Last 24 hours) at 08/01/11 0940 Last data filed at 08/01/11 0654  Gross per 24 hour  Intake 1579.75 ml  Output   1375 ml  Net 204.75 ml    Weight change: -9 kg (-19 lb 13.5 oz)  HENT: normocephalic; type marks the tongue bilaterally; jaw clenched  Eyes: pupils equal round and reactive to light; wandering, disconjugate gaze  Neck: supple  Heart: regular rate and rhythm; tachycardic  Lungs: clear to auscultation bilaterally; breathing spontaneously; O2 saturation normal on NRB  Abdomen: soft; nondistended  Extremities: No deformity; pulses normal  Neurologic: More alert, otherwise no gross focal neurologic deficits moving all 4 extremities spontaneously  Skin: Warm and dry      Lab Results: Results for orders placed during the hospital encounter of 07/30/11 (from the past 24 hour(s))  CBC     Status: Abnormal   Collection Time   08/01/11  4:25 AM      Component Value Range   WBC 11.0 (*) 4.0 - 10.5 (K/uL)   RBC 5.04  4.22 - 5.81 (MIL/uL)   Hemoglobin 16.0  13.0 - 17.0 (g/dL)   HCT 40.9  81.1 - 91.4 (%)   MCV 84.3  78.0 - 100.0 (fL)   MCH 31.7  26.0 - 34.0 (pg)   MCHC 37.4 (*) 30.0 - 36.0 (g/dL)   RDW 78.2  95.6 - 21.3 (%)   Platelets 182  150 - 400 (K/uL)  BASIC METABOLIC PANEL     Status: Abnormal   Collection Time   08/01/11  4:25 AM      Component Value Range   Sodium 136  135 - 145 (mEq/L)   Potassium 4.2  3.5 - 5.1 (mEq/L)   Chloride 101  96 - 112 (mEq/L)   CO2 22  19 - 32 (mEq/L)   Glucose, Bld 62 (*) 70 - 99 (mg/dL)   BUN 10  6 - 23  (mg/dL)   Creatinine, Ser 0.86  0.50 - 1.35 (mg/dL)   Calcium 9.0  8.4 - 57.8 (mg/dL)   GFR calc non Af Amer >90  >90 (mL/min)   GFR calc Af Amer >90  >90 (mL/min)  PHENYTOIN LEVEL, TOTAL     Status: Abnormal   Collection Time   08/01/11  4:25 AM      Component Value Range   Phenytoin Lvl 23.6 (*) 10.0 - 20.0 (ug/mL)  ALBUMIN     Status: Normal   Collection Time   08/01/11  4:25 AM      Component Value Range   Albumin 3.7  3.5 - 5.2 (g/dL)     Micro: Recent Results (from the past 240 hour(s))  CULTURE, BLOOD (ROUTINE X 2)     Status: Normal (Preliminary result)   Collection Time   07/30/11  8:15 AM      Component Value Range Status Comment   Specimen Description BLOOD RIGHT HAND   Final    Special Requests BOTTLES DRAWN  AEROBIC AND ANAEROBIC 5 CC EACH   Final    Setup Time [id number]   Final    Culture     Final    Value:        BLOOD CULTURE RECEIVED NO GROWTH TO DATE CULTURE WILL BE HELD FOR 5 DAYS BEFORE ISSUING A FINAL NEGATIVE REPORT   Report Status PENDING   Incomplete   CULTURE, BLOOD (ROUTINE X 2)     Status: Normal (Preliminary result)   Collection Time   07/30/11  8:20 AM      Component Value Range Status Comment   Specimen Description BLOOD LEFT HAND   Final    Special Requests BOTTLES DRAWN AEROBIC AND ANAEROBIC 3 CC EACH   Final    Setup Time [id number]   Final    Culture     Final    Value:        BLOOD CULTURE RECEIVED NO GROWTH TO DATE CULTURE WILL BE HELD FOR 5 DAYS BEFORE ISSUING A FINAL NEGATIVE REPORT   Report Status PENDING   Incomplete   MRSA PCR SCREENING     Status: Normal   Collection Time   07/30/11 11:24 AM      Component Value Range Status Comment   MRSA by PCR NEGATIVE  NEGATIVE  Final     Studies/Results: Mr Laqueta Jean Wo Contrast  07/31/2011  *RADIOLOGY REPORT*  Clinical Data: The patient was found unresponsive.  History of seizure disorder.  MRI HEAD WITHOUT AND WITH CONTRAST  Technique:  Multiplanar, multiecho pulse sequences of the brain  and surrounding structures were obtained according to standard protocol without and with intravenous contrast  Contrast: 10mL MULTIHANCE GADOBENATE DIMEGLUMINE 529 MG/ML IV SOLN  Comparison: Most recent CT 07/30/2011.  Most recent MR 06/15/2009.  Findings: Mild motion degradation is present.  Overall study is diagnostic.  There is no evidence for acute infarction, intracranial hemorrhage, mass lesion, hydrocephalus, or extra-axial fluid.  There is no atrophy or white matter disease.  No foci of chronic hemorrhage. Major intracranial vascular structures including major dural venous sinuses are patent.  Normal pituitary, pineal, and cerebellar tonsils.  Upper cervical region unremarkable.  Post infusion, no abnormal enhancement of the brain or meninges.  Mild chronic sinus disease in the right division of the sphenoid with mucosal thickening and a retention cyst.  IMPRESSION: Unremarkable MRI brain.  No acute or focal intracranial abnormality.  No change from prior normal studies.  Original Report Authenticated By: Elsie Stain, M.D.   Dg Shoulder Left  07/30/2011  *RADIOLOGY REPORT*  Clinical Data: Pain, limited range of motion  LEFT SHOULDER - 2+ VIEW  Comparison: 11/16/2010  Findings: No fracture or dislocation.  AC joint distance is normal. Humeral head deformity could represent remote Hill-Sachs deformity. Left lung apex is clear.  IMPRESSION: No acute left humeral fracture or dislocation.  Original Report Authenticated By: Harrel Lemon, M.D.    Medications:  Scheduled Meds:   . antiseptic oral rinse  15 mL Mouth Rinse q12n4p  . chlorhexidine  15 mL Mouth Rinse BID  . enoxaparin  40 mg Subcutaneous Q24H  . levofloxacin  500 mg Oral Daily  . phenytoin (DILANTIN) IV  100 mg Intravenous Q8H  . DISCONTD: ipratropium  0.5 mg Nebulization Q6H  . DISCONTD: levofloxacin (LEVAQUIN) IV  750 mg Intravenous Q24H  . DISCONTD: phenytoin (DILANTIN) IV  1,000 mg Intravenous Once   Continuous Infusions:     . 0.9 % NaCl with KCl 40 mEq /  L 75 mL/hr at 08/01/11 0606   PRN Meds:.acetaminophen, acetaminophen, albuterol, gadobenate dimeglumine, LORazepam, ondansetron (ZOFRAN) IV, ondansetron   Assessment:   #1 status epilepticus. The patient's CT scan is essentially normal, his elevated CKs consistent with this status epilepticus. Altered mental status is because of postictal delirium. He is clinically has improved. His Dilantin level is subtherapeutic. His urine drug screen EtOH and salicylate level is negative. He is probably at baseline today.  . #2 leukocytosis improved , could be secondary to stress margination versus secondary to aspiration. We'll start him empirically on Levaquin.  #3 hypokalemia resolved  #4 disposition transfer out of stepdown     LOS: 2 days   [facility] 08/01/2011, 9:40 AM

## 2011-08-01 NOTE — Progress Notes (Signed)
Per State Regulation 482.30 This chart was reviewed for medical necessity with respect to the patient's Admission/Duration of stay.    Sarinah Doetsch K                 Nurse Care Manager              Next Review Date:08-04-11   

## 2011-08-01 NOTE — Progress Notes (Signed)
Spoke with patient at bedside. Appears not to understand at times, when asked questions he will not answer and just stares downward. Mother in the room at time of visit, she was unable to answer questions about meds or PCP, she connected me with Douglas Rivas, patients stepfather. He states patient lives with he and patient's mother, he stays with his brother at another residence frequently. Douglas Rivas states patient can speak [place] and understands, manages own health issues, attends college at [employer], and graduated from a local high school. Douglas Rivas states when patient is at his home they remind him to take his meds but when he is at his brothers home he is unsure about what happens. Douglas Rivas states patient has Dilantin at home and that there is no issue with affording it. They do have issues with getting refills as patient has lost contact with [facility]. Douglas Rivas states orange card is expired. I readdressed patient after talking with Douglas Rivas, patient confirms that he stays with his brother and sometimes will not take meds. When asked why he does not answer. I asked if the med had adverse side effects and the patient did not answer. Patient did not answer most questions but would stare downward. Patient is agreeable to [facility] referral and [facility] appt. Will try to see patient without mother present to see if he will be more forthcoming.

## 2011-08-01 NOTE — Progress Notes (Signed)
PT NOTE:  13:15  Observed pt in room and hallway ambulating I'ly.  Pt walking backwards in room with no deficits or balance issues.  No acute PT needs identified.  Screen only, d/c from PT. Clydie Braun L. Katrinka Blazing,  Pager [phone] 08/01/2011

## 2011-08-01 NOTE — Progress Notes (Signed)
Seen and evaluated by Speech Therapist, Sherilyn Dacosta for Regular Diet.

## 2011-08-01 NOTE — Progress Notes (Signed)
Speech Language/Pathology Clinical/Bedside Swallow Evaluation Patient Details  Name: Diane Mochizuki MRN: [id number] DOB: 05-Dec-1985 Today's Date: 08/01/2011 [id number] Past Medical History:  Past Medical History  Diagnosis Date  . Seizures    Past Surgical History: No past surgical history on file. HPI:     Pt is a 26 year old admitted to [facility] 07/30/11 after found unresponsive, ? Asp pna. PMH + for seizure d/o.  CXR 07/30/11 was negative, old midsternal fx, CT Head 07/30/11 mild chronic sinus disease, no acute.  MRI Head 07/31/11 negative.  ? Of chronic dysphagia, ? 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  Pt denies dysphagia and acknowledges hunger.     Assessment/Recommendations/Treatment Plan    SLP Assessment Clinical Impression Statement: Pt presents with a functional oropharyngeal swallow.  Timely swallow with clear voice throughout.  No further SLP needs.  Rec regular/thin.   Risk for Aspiration: None if eating when fully alert  Swallow Evaluation Recommendations Solid Consistency: Regular Liquid Consistency: Thin Liquid Administration via: Cup;Straw Medication Administration: Whole meds with liquid Supervision: Patient able to self feed Compensations: Slow rate;Small sips/bites Postural Changes and/or Swallow Maneuvers: Seated upright 90 degrees Oral Care Recommendations: Oral care BID  Treatment Plan Treatment Plan Recommendations: No treatment recommended at this time     Individuals Consulted Consulted and Agree with Results and Recommendations: Patient   General   Pt is a 26 year old admitted to [facility] 07/30/11 after found unresponsive, ? Asp pna. PMH + for seizure d/o.  CXR 07/30/11 was negative, old midsternal fx, CT Head 07/30/11 mild chronic sinus disease, no acute.  MRI Head 07/31/11 negative.  ? Of chronic dysphagia, ? 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  Pt denies dysphagia and acknowledges hunger.    Oral Motor/Sensory  Function  Overall Oral Motor/Sensory Function: Appears within functional limits for tasks assessed  Consistency Results  Ice Chips Ice chips: Not tested  Thin Liquid Thin Liquid: Within functional limits Presentation: Self Fed;Straw  Nectar Thick Liquid Nectar Thick Liquid: Not tested  Honey Thick Liquid Honey Thick Liquid: Not tested  Puree Puree: Within functional limits Presentation: Self Fed;Spoon  Solid Solid: Within functional limits Presentation: Self Fed   Chales Abrahams 08/01/2011,9:00 AM

## 2011-08-01 NOTE — Progress Notes (Signed)
OT Note:  Noted patient has been up and independent in his room.  He states he is not having any difficulty with ADLS, upper body strength/coordination.  Screen only. Will sign off.  [place], [place] [phone] 08/01/2011

## 2011-08-01 NOTE — Consult Note (Addendum)
Subjective: Patient has not been seizing clinically.   Objective: Vital signs in last 24 hours: Temp:  [98.3 F (36.8 C)-99 F (37.2 C)] 98.6 F (37 C) (01/21 0540) Pulse Rate:  [77-97] 78  (01/21 0540) Resp:  [15-24] 16  (01/21 0540) BP: (113-143)/(56-91) 130/75 mmHg (01/21 0540) SpO2:  [98 %-100 %] 99 % (01/21 0540) Weight:  [48 kg (105 lb 13.1 oz)] 48 kg (105 lb 13.1 oz) (01/20 1430)  Intake/Output from previous day: 01/20 0701 - 01/21 0700 In: 2029.8 [I.V.:1623.8; IV Piggyback:306] Out: 1375 [Urine:1375]   Nutritional status: General  Neurological exam: patient is a little slow to answer questions, has difficulty telling me months of the year, not obviously aphasic, followed complex commands with some difficulty, Cranial nerves: EOMI, PERRL. Visual fields were full. Sensation to V1 through V3 areas of the face was intact and symmetric throughout. There was no facial asymmetry. Hearing to finger rub was equal and symmetrical bilaterally. Shoulder shrug was 5/5 and symmetric bilaterally. Head rotation was 5/5 bilaterally. There was no dysarthria or palatal deviation.Motor: strength was 5/5 and symmetric throughout except proximal left arm which was pain-limited. Sensory: was intact throughout to light touch, pinprick Coordination: finger-to-nose were intact on right, could not assess left due to shoulder pain and difficulty elevating arm. Reflexes: were 2+ in upper extremities and 1+ at the knees and 1+ at the ankles. Plantar response was downgoing bilaterally. Gait: deferred  Lab Results:  Basename 08/01/11 0425 07/31/11 0315  WBC 11.0* 13.7*  HGB 16.0 14.9  HCT 42.5 39.6  PLT 182 209  NA 136 138  K 4.2 4.2  CL 101 107  CO2 22 23  GLUCOSE 62* 69*  BUN 10 8  CREATININE 1.07 1.12  CALCIUM 9.0 8.6  LABA1C -- --   Studies/Results: Dg Chest 2 View  07/30/2011  *RADIOLOGY REPORT*  Clinical Data: Unresponsive.  History of seizures.  Question pneumonia.  CHEST - 2 VIEW   Comparison: Portable examination 05/30/2011.  Findings: There are low lung volumes.  Heart size and mediastinal contours are stable.  The lungs are clear.  There is no pleural effusion or pneumothorax.  Deformity of the sternum on the lateral view does not appear acute but is not imaged on the prior portable examinations.  No acute osseous findings are identified.  Multiple telemetry leads overlie the chest.  IMPRESSION:  1.  No evidence of pneumonia or other acute cardiopulmonary process. 2.  Suspected old mid sternal fracture.  Original Report Authenticated By: Gerrianne Scale, M.D.   Ct Head Wo Contrast  07/30/2011  *RADIOLOGY REPORT*  Clinical Data: History of seizure disorder.  Unresponsive.  CT HEAD WITHOUT CONTRAST  Technique:  Contiguous axial images were obtained from the base of the skull through the vertex without contrast.  Comparison: Prior examinations 10/19/2010 and 07/04/2009.  Findings: Some images were repeated due to motion.  Cavum septum pellucidum and vergae are noted. There is no evidence of acute intracranial hemorrhage, mass lesion, brain edema or extra-axial fluid collection.  The ventricles and subarachnoid spaces are appropriately sized for age.  There is no CT evidence of acute cortical infarction.  Right sphenoid sinus mucosal thickening is demonstrated.  The visualized sinuses are otherwise clear. The calvarium is intact.  IMPRESSION: No acute intracranial findings.  Right sphenoid sinus mucosal thickening.  Original Report Authenticated By: Gerrianne Scale, M.D.   Mr Laqueta Jean Wo Contrast  07/31/2011  *RADIOLOGY REPORT*  Clinical Data: The patient was found unresponsive.  History of seizure disorder.  MRI HEAD WITHOUT AND WITH CONTRAST  Technique:  Multiplanar, multiecho pulse sequences of the brain and surrounding structures were obtained according to standard protocol without and with intravenous contrast  Contrast: 10mL MULTIHANCE GADOBENATE DIMEGLUMINE 529 MG/ML IV SOLN   Comparison: Most recent CT 07/30/2011.  Most recent MR 06/15/2009.  Findings: Mild motion degradation is present.  Overall study is diagnostic.  There is no evidence for acute infarction, intracranial hemorrhage, mass lesion, hydrocephalus, or extra-axial fluid.  There is no atrophy or white matter disease.  No foci of chronic hemorrhage. Major intracranial vascular structures including major dural venous sinuses are patent.  Normal pituitary, pineal, and cerebellar tonsils.  Upper cervical region unremarkable.  Post infusion, no abnormal enhancement of the brain or meninges.  Mild chronic sinus disease in the right division of the sphenoid with mucosal thickening and a retention cyst.  IMPRESSION: Unremarkable MRI brain.  No acute or focal intracranial abnormality.  No change from prior normal studies.  Original Report Authenticated By: Elsie Stain, M.D.   Dg Shoulder Left  07/30/2011  *RADIOLOGY REPORT*  Clinical Data: Pain, limited range of motion  LEFT SHOULDER - 2+ VIEW  Comparison: 11/16/2010  Findings: No fracture or dislocation.  AC joint distance is normal. Humeral head deformity could represent remote Hill-Sachs deformity. Left lung apex is clear.  IMPRESSION: No acute left humeral fracture or dislocation.  Original Report Authenticated By: Harrel Lemon, M.D.   Medications: I have reviewed the patient's current medications.  Assessment/Plan: 26 years old man with epilepsy - now loaded with Dilantin. There is some concern by the team that the patient is not back to baseline. I would recommend clarifying the baseline before proceeding with an EEG as his language could be a major barrier to his interactions with people. If he is not felt to be at baseline, than there may have been some mild degree of anoxic brain injury from prolonged acidosis. Chronic Dysphagia work-up as per primary team. EEG. Call with questions.    LOS: 2 days   Douglas Rivas

## 2011-08-02 ENCOUNTER — Inpatient Hospital Stay (HOSPITAL_COMMUNITY)
Admit: 2011-08-02 | Discharge: 2011-08-02 | Disposition: A | Discharge status: Home / Self Care | Payer: Self-pay | Attending: Internal Medicine | Admitting: Internal Medicine

## 2011-08-02 LAB — CBC
HCT: 40.3 % (ref 39.0–52.0)
Hemoglobin: 15.3 g/dL (ref 13.0–17.0)
MCHC: 38 g/dL — ABNORMAL HIGH (ref 30.0–36.0)
RBC: 4.82 MIL/uL (ref 4.22–5.81)

## 2011-08-02 LAB — BASIC METABOLIC PANEL
BUN: 16 mg/dL (ref 6–23)
CO2: 25 mEq/L (ref 19–32)
GFR calc non Af Amer: 90 mL/min — ABNORMAL LOW (ref >90)
Glucose, Bld: 79 mg/dL (ref 70–99)
Potassium: 3.7 mEq/L (ref 3.5–5.1)
Sodium: 137 mEq/L (ref 135–145)

## 2011-08-02 MED ORDER — PHENYTOIN SODIUM EXTENDED 100 MG PO CAPS: 100.0000 mg | ORAL_CAPSULE | Freq: Three times a day (TID) | ORAL | Status: DC

## 2011-08-02 NOTE — Progress Notes (Signed)
CSW received referral that patient has no insurance. CSW made financial counselor, Jasmine December [phone]) aware. CSW signing off.   Unice Bailey, LCSWA [phone]

## 2011-08-02 NOTE — Progress Notes (Signed)
EEG performed.

## 2011-08-02 NOTE — Consult Note (Signed)
Patient's EEG is normal. From neurological perspective, he is clear to go with neurology follow-up.  Carmell Austria, MD

## 2011-08-02 NOTE — Discharge Summary (Signed)
Physician Discharge Summary  Douglas Rivas MRN: [id number] DOB/AGE: [birth year] 26 y.o.  PCP: No primary provider on file.   Admit date: 07/30/2011 Discharge date: 08/02/2011  Discharge Diagnoses:     *Aspiration pneumonia  Status epilepticus  SEIZURE DISORDER  Medical noncompliance     Medication List  As of 08/02/2011 11:52 AM   TAKE these medications         phenytoin 100 MG ER capsule   Commonly known as: DILANTIN   Take 1 capsule (100 mg total) by mouth 3 (three) times daily.            Discharge Condition: Able Disposition: Home or Self Care   Consults: Neurology  Significant Diagnostic Studies: Dg Chest 2 View  07/30/2011  *RADIOLOGY REPORT*  Clinical Data: Unresponsive.  History of seizures.  Question pneumonia.  CHEST - 2 VIEW  Comparison: Portable examination 05/30/2011.  Findings: There are low lung volumes.  Heart size and mediastinal contours are stable.  The lungs are clear.  There is no pleural effusion or pneumothorax.  Deformity of the sternum on the lateral view does not appear acute but is not imaged on the prior portable examinations.  No acute osseous findings are identified.  Multiple telemetry leads overlie the chest.  IMPRESSION:  1.  No evidence of pneumonia or other acute cardiopulmonary process. 2.  Suspected old mid sternal fracture.  Original Report Authenticated By: Gerrianne Scale, M.D.   Ct Head Wo Contrast  07/30/2011  *RADIOLOGY REPORT*  Clinical Data: History of seizure disorder.  Unresponsive.  CT HEAD WITHOUT CONTRAST  Technique:  Contiguous axial images were obtained from the base of the skull through the vertex without contrast.  Comparison: Prior examinations 10/19/2010 and 07/04/2009.  Findings: Some images were repeated due to motion.  Cavum septum pellucidum and vergae are noted. There is no evidence of acute intracranial hemorrhage, mass lesion, brain edema or extra-axial fluid collection.  The ventricles and subarachnoid  spaces are appropriately sized for age.  There is no CT evidence of acute cortical infarction.  Right sphenoid sinus mucosal thickening is demonstrated.  The visualized sinuses are otherwise clear. The calvarium is intact.  IMPRESSION: No acute intracranial findings.  Right sphenoid sinus mucosal thickening.  Original Report Authenticated By: Gerrianne Scale, M.D.   Mr Laqueta Jean Wo Contrast  07/31/2011  *RADIOLOGY REPORT*  Clinical Data: The patient was found unresponsive.  History of seizure disorder.  MRI HEAD WITHOUT AND WITH CONTRAST  Technique:  Multiplanar, multiecho pulse sequences of the brain and surrounding structures were obtained according to standard protocol without and with intravenous contrast  Contrast: 10mL MULTIHANCE GADOBENATE DIMEGLUMINE 529 MG/ML IV SOLN  Comparison: Most recent CT 07/30/2011.  Most recent MR 06/15/2009.  Findings: Mild motion degradation is present.  Overall study is diagnostic.  There is no evidence for acute infarction, intracranial hemorrhage, mass lesion, hydrocephalus, or extra-axial fluid.  There is no atrophy or white matter disease.  No foci of chronic hemorrhage. Major intracranial vascular structures including major dural venous sinuses are patent.  Normal pituitary, pineal, and cerebellar tonsils.  Upper cervical region unremarkable.  Post infusion, no abnormal enhancement of the brain or meninges.  Mild chronic sinus disease in the right division of the sphenoid with mucosal thickening and a retention cyst.  IMPRESSION: Unremarkable MRI brain.  No acute or focal intracranial abnormality.  No change from prior normal studies.  Original Report Authenticated By: Elsie Stain, M.D.   Dg Shoulder Left  07/30/2011  *RADIOLOGY REPORT*  Clinical Data: Pain, limited range of motion  LEFT SHOULDER - 2+ VIEW  Comparison: 11/16/2010  Findings: No fracture or dislocation.  AC joint distance is normal. Humeral head deformity could represent remote Hill-Sachs deformity.  Left lung apex is clear.  IMPRESSION: No acute left humeral fracture or dislocation.  Original Report Authenticated By: Harrel Lemon, M.D.      Microbiology: Recent Results (from the past 240 hour(s))  PATHOLOGIST SMEAR REVIEW     Status: Normal   Collection Time   07/30/11  4:15 AM      Component Value Range Status Comment   Tech Review Reviewed By Havery Moros, M.D.   Final   CULTURE, BLOOD (ROUTINE X 2)     Status: Normal (Preliminary result)   Collection Time   07/30/11  8:15 AM      Component Value Range Status Comment   Specimen Description BLOOD RIGHT HAND   Final    Special Requests BOTTLES DRAWN AEROBIC AND ANAEROBIC 5 CC EACH   Final    Setup Time [id number]   Final    Culture     Final    Value:        BLOOD CULTURE RECEIVED NO GROWTH TO DATE CULTURE WILL BE HELD FOR 5 DAYS BEFORE ISSUING A FINAL NEGATIVE REPORT   Report Status PENDING   Incomplete   CULTURE, BLOOD (ROUTINE X 2)     Status: Normal (Preliminary result)   Collection Time   07/30/11  8:20 AM      Component Value Range Status Comment   Specimen Description BLOOD LEFT HAND   Final    Special Requests BOTTLES DRAWN AEROBIC AND ANAEROBIC 3 CC EACH   Final    Setup Time [id number]   Final    Culture     Final    Value:        BLOOD CULTURE RECEIVED NO GROWTH TO DATE CULTURE WILL BE HELD FOR 5 DAYS BEFORE ISSUING A FINAL NEGATIVE REPORT   Report Status PENDING   Incomplete   MRSA PCR SCREENING     Status: Normal   Collection Time   07/30/11 11:24 AM      Component Value Range Status Comment   MRSA by PCR NEGATIVE  NEGATIVE  Final      Labs: Results for orders placed during the hospital encounter of 07/30/11 (from the past 48 hour(s))  CBC     Status: Abnormal   Collection Time   08/01/11  4:25 AM      Component Value Range Comment   WBC 11.0 (*) 4.0 - 10.5 (K/uL)    RBC 5.04  4.22 - 5.81 (MIL/uL)    Hemoglobin 16.0  13.0 - 17.0 (g/dL)    HCT 78.4  69.6 - 29.5 (%)    MCV 84.3  78.0 - 100.0 (fL)     MCH 31.7  26.0 - 34.0 (pg)    MCHC 37.4 (*) 30.0 - 36.0 (g/dL) ELLIPTOCYTES   RDW 28.4  11.5 - 15.5 (%)    Platelets 182  150 - 400 (K/uL)   BASIC METABOLIC PANEL     Status: Abnormal   Collection Time   08/01/11  4:25 AM      Component Value Range Comment   Sodium 136  135 - 145 (mEq/L)    Potassium 4.2  3.5 - 5.1 (mEq/L)    Chloride 101  96 - 112 (mEq/L)    CO2 22  19 - 32 (mEq/L)    Glucose, Bld 62 (*) 70 - 99 (mg/dL)    BUN 10  6 - 23 (mg/dL)    Creatinine, Ser 4.09  0.50 - 1.35 (mg/dL)    Calcium 9.0  8.4 - 10.5 (mg/dL)    GFR calc non Af Amer >90  >90 (mL/min)    GFR calc Af Amer >90  >90 (mL/min)   PHENYTOIN LEVEL, TOTAL     Status: Abnormal   Collection Time   08/01/11  4:25 AM      Component Value Range Comment   Phenytoin Lvl 23.6 (*) 10.0 - 20.0 (ug/mL)   ALBUMIN     Status: Normal   Collection Time   08/01/11  4:25 AM      Component Value Range Comment   Albumin 3.7  3.5 - 5.2 (g/dL)   CBC     Status: Abnormal   Collection Time   08/02/11  4:20 AM      Component Value Range Comment   WBC 8.9  4.0 - 10.5 (K/uL)    RBC 4.82  4.22 - 5.81 (MIL/uL)    Hemoglobin 15.3  13.0 - 17.0 (g/dL)    HCT 81.1  91.4 - 78.2 (%)    MCV 83.6  78.0 - 100.0 (fL)    MCH 31.7  26.0 - 34.0 (pg)    MCHC 38.0 (*) 30.0 - 36.0 (g/dL) ELLIPTOCYTES   RDW 95.6  11.5 - 15.5 (%)    Platelets 194  150 - 400 (K/uL)   BASIC METABOLIC PANEL     Status: Abnormal   Collection Time   08/02/11  4:20 AM      Component Value Range Comment   Sodium 137  135 - 145 (mEq/L)    Potassium 3.7  3.5 - 5.1 (mEq/L)    Chloride 105  96 - 112 (mEq/L)    CO2 25  19 - 32 (mEq/L)    Glucose, Bld 79  70 - 99 (mg/dL)    BUN 16  6 - 23 (mg/dL)    Creatinine, Ser 2.13  0.50 - 1.35 (mg/dL)    Calcium 8.9  8.4 - 10.5 (mg/dL)    GFR calc non Af Amer 90 (*) >90 (mL/min)    GFR calc Af Amer >90  >90 (mL/min)   CK     Status: Abnormal   Collection Time   08/02/11  9:15 AM      Component Value Range Comment   Total CK  841 (*) 7 - 232 (U/L)      HPI :Douglas Rivas is an 26 y.o. male. With history of epilepsy who was found by mother unresponsive this am. He has subsequently been post-ictal and confused. He has no insurance. He was brought to ED 3 months ago and started on Dilantin, but it is not certain that he followed up with neurology or if he was taking Dilantin. Communication with patient and family is difficult due to language barrier - however, beyond the above mentioned, family could not provide further information. Patient denies using drugs. Dilantin level in ED was less than 2.5. Utox negative.   HOSPITAL COURSE: #1 status epilepticus. Primarily because of medical noncompliance The patient's CT scan is essentially normal, his elevated CKs consistent with this status epilepticus. Altered mental status is because of postictal delirium. Dilantin level was subtherapeutic upon admission. His urine drug screen EtOH and salicylate level is negative. Given his fever meningitis would be in the differential, however at  this time his neurologic exam as well as history does not suggest the possibility of meningitis. Therefore we'll defer a lumbar puncture at this point. The patient has been loaded with Keppra 1 g IV. He was subsequently switched to Dilantin 100 mg IV 3 times a day. He was seen by neurology Dr. Lyman Speller. He had an MRI of the brain with results as above  #2 leukocytosis he was thought to have had either a chemical pneumonitis or aspiration pneumonia. It was treated with Levaquin for 3 days.  #3 hypokalemia this was repleted during this hospitalization  #4 medical noncompliance the patient has been instructed to come be compliant with his Dilantin  #5 rhabdomyolysis the patient was treated with IV fluids with improvement in his CK   Discharge Exam:  Blood pressure 137/80, pulse 79, temperature 97.8 F (36.6 C), temperature source Oral, resp. rate 18, height 5\' 6"  (1.676 m), weight 48 kg (105 lb 13.1  oz), SpO2 98.00%.  HENT: normocephalic; type marks the tongue bilaterally; jaw clenched  Eyes: pupils equal round and reactive to light; wandering, disconjugate gaze  Neck: supple  Heart: regular rate and rhythm; tachycardic  Lungs: clear to auscultation bilaterally; breathing spontaneously; O2 saturation normal on NRB  Abdomen: soft; nondistended  Extremities: No deformity; pulses normal  Neurologic: More alert, otherwise no gross focal neurologic deficits moving all 4 extremities spontaneously  Skin: Warm and dry     Discharge Orders    Future Orders Please Complete By Expires   Diet - low sodium heart healthy      Increase activity slowly      Call MD for:  persistant nausea and vomiting      Call MD for:  severe uncontrolled pain      Call MD for:  difficulty breathing, headache or visual disturbances         Follow-up Information    Follow up with [facility] on 08/19/2011. (For eligibility at 9:30 am)    Contact information:   [phone]      Follow up with [facility] on 09/20/2011. (at 2:30 pm with Dr. Kellie Shropshire)    Contact information:   [phone]         Signed: Richarda Overlie 08/02/2011, 11:52 AM

## 2011-08-02 NOTE — Progress Notes (Signed)
Subjective: No change.  Objective: Vital signs in last 24 hours: Temp:  [97.8 F (36.6 C)-97.9 F (36.6 C)] 97.8 F (36.6 C) (01/22 0525) Pulse Rate:  [79-102] 79  (01/22 0525) Resp:  [16-18] 18  (01/22 0525) BP: (131-149)/(80-92) 137/80 mmHg (01/22 0525) SpO2:  [97 %-98 %] 98 % (01/22 0525)  Intake/Output from previous day: 01/21 0701 - 01/22 0700 In: 880 [P.O.:480; I.V.:400] Out: 300 [Urine:300] Intake/Output this shift:   Nutritional status: General  Neurological exam: patient is a little slow to answer questions, has difficulty telling me months of the year, not obviously aphasic, followed complex commands with some difficulty, Cranial nerves: EOMI, PERRL. Visual fields were full. Sensation to V1 through V3 areas of the face was intact and symmetric throughout. There was no facial asymmetry. Hearing to finger rub was equal and symmetrical bilaterally. Shoulder shrug was 5/5 and symmetric bilaterally. Head rotation was 5/5 bilaterally. There was no dysarthria or palatal deviation.Motor: strength was 5/5 and symmetric throughout except proximal left arm which was pain-limited. Sensory: was intact throughout to light touch, pinprick Coordination: finger-to-nose were intact on right, could not assess left due to shoulder pain and difficulty elevating arm. Reflexes: were 2+ in upper extremities and 1+ at the knees and 1+ at the ankles. Plantar response was downgoing bilaterally. Gait: deferred  Lab Results:  Basename 08/02/11 0420 08/01/11 0425  WBC 8.9 11.0*  HGB 15.3 16.0  HCT 40.3 42.5  PLT 194 182  NA 137 136  K 3.7 4.2  CL 105 101  CO2 25 22  GLUCOSE 79 62*  BUN 16 10  CREATININE 1.11 1.07  CALCIUM 8.9 9.0  LABA1C -- --   Studies/Results: Mr Laqueta Jean Wo Contrast  07/31/2011  *RADIOLOGY REPORT*  Clinical Data: The patient was found unresponsive.  History of seizure disorder.  MRI HEAD WITHOUT AND WITH CONTRAST  Technique:  Multiplanar, multiecho pulse sequences of the  brain and surrounding structures were obtained according to standard protocol without and with intravenous contrast  Contrast: 10mL MULTIHANCE GADOBENATE DIMEGLUMINE 529 MG/ML IV SOLN  Comparison: Most recent CT 07/30/2011.  Most recent MR 06/15/2009.  Findings: Mild motion degradation is present.  Overall study is diagnostic.  There is no evidence for acute infarction, intracranial hemorrhage, mass lesion, hydrocephalus, or extra-axial fluid.  There is no atrophy or white matter disease.  No foci of chronic hemorrhage. Major intracranial vascular structures including major dural venous sinuses are patent.  Normal pituitary, pineal, and cerebellar tonsils.  Upper cervical region unremarkable.  Post infusion, no abnormal enhancement of the brain or meninges.  Mild chronic sinus disease in the right division of the sphenoid with mucosal thickening and a retention cyst.  IMPRESSION: Unremarkable MRI brain.  No acute or focal intracranial abnormality.  No change from prior normal studies.  Original Report Authenticated By: Elsie Stain, M.D.   Medications: I have reviewed the patient's current medications.  Assessment/Plan: 1) EEG - will review report later today and put in a preliminary report. If no active seizures, will sign off. 2) Will need SW help with affording Dilantin 3) Outpatient neurology follow-up upon discharge  LOS: 3 days   Douglas Rivas

## 2011-08-03 NOTE — Procedures (Signed)
EEG ID:  [id number].  HISTORY:  This is a 26 years old man admitted for status epilepticus.  MEDICATIONS:  Dilantin.  CONDITION OF RECORDING:  This 16-lead EEG was recorded with the patient in awake and drowsy states.  Background rhythms: background patterns in wakefulness were well organized with a well sustained posterior dominant of 10.5 Hz, symmetrical and reactive to eye opening and closing.  Drowsiness was associated with mild attenuation of voltage and slowing frequencies. Abnormal potentials: no epileptiform activity or focal slowing was noted.  ACTIVATION PROCEDURES:  Hyperventilation was not performed.  Photic stimulation was not performed.  EKG:  Single-channel of EKG monitoring was unremarkable.  IMPRESSION:  This is a normal awake and drowsy EEG.  A normal EEG does not rule out the clinical diagnosis of epilepsy. If clinically warranted, a repeat extended EEG or ambulatory requiring may be obtained for prolonged recording times and sleep capture, which may increase The diagnostic yield.  Clinical correlation is suggested.          ______________________________ Carmell Austria, MD    [id number] D:  08/02/2011 13:08:17  T:  08/02/2011 20:36:32  Job #:  [id number]

## 2011-08-05 LAB — CULTURE, BLOOD (ROUTINE X 2)
Culture  Setup Time: 201301191357
Culture: NO GROWTH

## 2011-12-17 ENCOUNTER — Encounter (HOSPITAL_COMMUNITY): Payer: Self-pay | Admitting: *Deleted

## 2011-12-17 ENCOUNTER — Emergency Department (HOSPITAL_COMMUNITY): Payer: Self-pay

## 2011-12-17 ENCOUNTER — Inpatient Hospital Stay (HOSPITAL_COMMUNITY)
Admission: EM | Admit: 2011-12-17 | Discharge: 2011-12-20 | DRG: 100 | Disposition: A | Discharge status: Home / Self Care | Payer: Self-pay | Attending: Internal Medicine | Admitting: Internal Medicine

## 2011-12-17 ENCOUNTER — Inpatient Hospital Stay (HOSPITAL_COMMUNITY): Payer: Self-pay

## 2011-12-17 DIAGNOSIS — M6282 Rhabdomyolysis: Secondary | ICD-10-CM | POA: Diagnosis present

## 2011-12-17 DIAGNOSIS — R4182 Altered mental status, unspecified: Secondary | ICD-10-CM | POA: Diagnosis present

## 2011-12-17 DIAGNOSIS — H6120 Impacted cerumen, unspecified ear: Secondary | ICD-10-CM

## 2011-12-17 DIAGNOSIS — R569 Unspecified convulsions: Secondary | ICD-10-CM

## 2011-12-17 DIAGNOSIS — I10 Essential (primary) hypertension: Secondary | ICD-10-CM | POA: Diagnosis present

## 2011-12-17 DIAGNOSIS — D7289 Other specified disorders of white blood cells: Secondary | ICD-10-CM

## 2011-12-17 DIAGNOSIS — E872 Acidosis, unspecified: Secondary | ICD-10-CM | POA: Diagnosis present

## 2011-12-17 DIAGNOSIS — G40401 Other generalized epilepsy and epileptic syndromes, not intractable, with status epilepticus: Secondary | ICD-10-CM

## 2011-12-17 DIAGNOSIS — R030 Elevated blood-pressure reading, without diagnosis of hypertension: Secondary | ICD-10-CM

## 2011-12-17 DIAGNOSIS — G9340 Encephalopathy, unspecified: Secondary | ICD-10-CM | POA: Diagnosis present

## 2011-12-17 DIAGNOSIS — R000 Tachycardia, unspecified: Secondary | ICD-10-CM | POA: Diagnosis present

## 2011-12-17 DIAGNOSIS — G40901 Epilepsy, unspecified, not intractable, with status epilepticus: Secondary | ICD-10-CM | POA: Diagnosis present

## 2011-12-17 DIAGNOSIS — E876 Hypokalemia: Secondary | ICD-10-CM | POA: Diagnosis present

## 2011-12-17 DIAGNOSIS — Z9119 Patient's noncompliance with other medical treatment and regimen: Secondary | ICD-10-CM

## 2011-12-17 DIAGNOSIS — J690 Pneumonitis due to inhalation of food and vomit: Secondary | ICD-10-CM

## 2011-12-17 DIAGNOSIS — Z91199 Patient's noncompliance with other medical treatment and regimen due to unspecified reason: Secondary | ICD-10-CM

## 2011-12-17 LAB — DIFFERENTIAL
Basophils Relative: 1 % (ref 0–1)
Eosinophils Absolute: 0.6 10*3/uL (ref 0.0–0.7)
Eosinophils Absolute: 0 10*3/uL (ref 0.0–0.7)
Lymphocytes Relative: 61 % — ABNORMAL HIGH (ref 12–46)
Lymphocytes Relative: 4 % — ABNORMAL LOW (ref 12–46)
Lymphs Abs: 0.6 10*3/uL — ABNORMAL LOW (ref 0.7–4.0)
Monocytes Relative: 9 % (ref 3–12)
Neutro Abs: 13.5 10*3/uL — ABNORMAL HIGH (ref 1.7–7.7)
Neutrophils Relative %: 26 % — ABNORMAL LOW (ref 43–77)
Neutrophils Relative %: 88 % — ABNORMAL HIGH (ref 43–77)

## 2011-12-17 LAB — BLOOD GAS, VENOUS
Bicarbonate: 16.4 mEq/L — ABNORMAL LOW (ref 20.0–24.0)
FIO2: 1 %
pCO2, Ven: 41.1 mmHg — ABNORMAL LOW (ref 45.0–50.0)
pH, Ven: 7.226 — ABNORMAL LOW (ref 7.250–7.300)
pO2, Ven: 58.7 mmHg — ABNORMAL HIGH (ref 30.0–45.0)

## 2011-12-17 LAB — GLUCOSE, CAPILLARY
Glucose-Capillary: 113 mg/dL — ABNORMAL HIGH (ref 70–99)
Glucose-Capillary: 80 mg/dL (ref 70–99)
Glucose-Capillary: 95 mg/dL (ref 70–99)

## 2011-12-17 LAB — BASIC METABOLIC PANEL
BUN: 10 mg/dL (ref 6–23)
Chloride: 98 mEq/L (ref 96–112)
GFR calc Af Amer: >90 mL/min (ref >90)
GFR calc non Af Amer: 82 mL/min — ABNORMAL LOW (ref >90)
Potassium: 3.7 mEq/L (ref 3.5–5.1)
Sodium: 144 mEq/L (ref 135–145)

## 2011-12-17 LAB — CBC
HCT: 51.2 % (ref 39.0–52.0)
Hemoglobin: 18.8 g/dL — ABNORMAL HIGH (ref 13.0–17.0)
MCHC: 36.7 g/dL — ABNORMAL HIGH (ref 30.0–36.0)
Platelets: 195 10*3/uL (ref 150–400)
RBC: 5.83 MIL/uL — ABNORMAL HIGH (ref 4.22–5.81)
RBC: 5.2 MIL/uL (ref 4.22–5.81)
WBC: 19.2 10*3/uL — ABNORMAL HIGH (ref 4.0–10.5)
WBC: 15.4 10*3/uL — ABNORMAL HIGH (ref 4.0–10.5)

## 2011-12-17 LAB — BLOOD GAS, ARTERIAL
Acid-base deficit: 3.1 mmol/L — ABNORMAL HIGH (ref 0.0–2.0)
Bicarbonate: 20.6 mEq/L (ref 20.0–24.0)
Drawn by: 340271
FIO2: 0.28 %
O2 Saturation: 99.4 %
TCO2: 17.5 mmol/L (ref 0–100)
pO2, Arterial: 152 mmHg — ABNORMAL HIGH (ref 80.0–100.0)

## 2011-12-17 LAB — RAPID URINE DRUG SCREEN, HOSP PERFORMED
Amphetamines: NOT DETECTED
Cocaine: NOT DETECTED
Opiates: NOT DETECTED

## 2011-12-17 LAB — PHENYTOIN LEVEL, TOTAL: Phenytoin Lvl: <2.5 ug/mL — ABNORMAL LOW (ref 10.0–20.0)

## 2011-12-17 LAB — COMPREHENSIVE METABOLIC PANEL
BUN: 9 mg/dL (ref 6–23)
CO2: 21 mEq/L (ref 19–32)
Chloride: 105 mEq/L (ref 96–112)
Creatinine, Ser: 1.01 mg/dL (ref 0.50–1.35)
GFR calc Af Amer: >90 mL/min (ref >90)
GFR calc non Af Amer: >90 mL/min (ref >90)
Total Bilirubin: 0.4 mg/dL (ref 0.3–1.2)

## 2011-12-17 LAB — MRSA PCR SCREENING: MRSA by PCR: NEGATIVE

## 2011-12-17 MED ORDER — ONDANSETRON HCL 4 MG/2ML IJ SOLN
4.0000 mg | Freq: Four times a day (QID) | INTRAMUSCULAR | Status: DC | PRN
  Administered 2011-12-17: 4 mg via INTRAVENOUS
  Filled 2011-12-17: qty 2

## 2011-12-17 MED ORDER — SODIUM CHLORIDE 0.9 % IV SOLN
1000.0000 mg | Freq: Once | INTRAVENOUS | Status: AC
  Administered 2011-12-17: 1000 mg via INTRAVENOUS
  Filled 2011-12-17: qty 20

## 2011-12-17 MED ORDER — POTASSIUM CHLORIDE 10 MEQ/100ML IV SOLN
10.0000 meq | INTRAVENOUS | Status: AC
  Administered 2011-12-17 (×3): 10 meq via INTRAVENOUS
  Filled 2011-12-17: qty 300

## 2011-12-17 MED ORDER — ONDANSETRON HCL 4 MG PO TABS: 4.0000 mg | ORAL_TABLET | Freq: Four times a day (QID) | ORAL | Status: DC | PRN

## 2011-12-17 MED ORDER — SODIUM CHLORIDE 0.9 % IV SOLN
INTRAVENOUS | Status: DC
  Administered 2011-12-17 – 2011-12-19 (×7): via INTRAVENOUS

## 2011-12-17 MED ORDER — LORAZEPAM 2 MG/ML IJ SOLN: 1.0000 mg | INTRAMUSCULAR | Status: DC | PRN

## 2011-12-17 MED ORDER — SODIUM CHLORIDE 0.9 % IJ SOLN
3.0000 mL | Freq: Two times a day (BID) | INTRAMUSCULAR | Status: DC
  Administered 2011-12-17 – 2011-12-19 (×4): 3 mL via INTRAVENOUS

## 2011-12-17 MED ORDER — LORAZEPAM 2 MG/ML IJ SOLN
4.0000 mg | Freq: Once | INTRAMUSCULAR | Status: AC
  Administered 2011-12-17: 4 mg via INTRAVENOUS

## 2011-12-17 MED ORDER — ACETAMINOPHEN 325 MG PO TABS: 650.0000 mg | ORAL_TABLET | Freq: Four times a day (QID) | ORAL | Status: DC | PRN

## 2011-12-17 MED ORDER — PHENYTOIN SODIUM 50 MG/ML IJ SOLN
100.0000 mg | Freq: Three times a day (TID) | INTRAMUSCULAR | Status: DC
  Administered 2011-12-17 – 2011-12-18 (×3): 100 mg via INTRAVENOUS
  Filled 2011-12-17 (×5): qty 2

## 2011-12-17 MED ORDER — ACETAMINOPHEN 650 MG RE SUPP: 650.0000 mg | Freq: Four times a day (QID) | RECTAL | Status: DC | PRN

## 2011-12-17 MED ORDER — SODIUM CHLORIDE 0.9 % IV SOLN
500.0000 mg | Freq: Once | INTRAVENOUS | Status: AC
  Administered 2011-12-17: 500 mg via INTRAVENOUS
  Filled 2011-12-17: qty 5

## 2011-12-17 NOTE — Progress Notes (Signed)
MEDICATION RELATED CONSULT NOTE - INITIAL   Pharmacy Consult for Phenytoin  Indication: seizures  No Known Allergies  Patient Measurements: Height: 5\' 2"  (157.5 cm) Weight: 120 lb (54.432 kg) IBW/kg (Calculated) : 54.6  Adjusted Body Weight:   Vital Signs: Temp: 99.4 F (37.4 C) (06/08 1000) Temp src: Rectal (06/08 0351) BP: 127/73 mmHg (06/08 1000) Pulse Rate: 96  (06/08 1000) Intake/Output from previous day: 06/07 0701 - 06/08 0700 In: -  Out: 700 [Urine:700] Intake/Output from this shift: Total I/O In: 1350 [I.V.:1350] Out: -   Labs:  Basename 12/17/11 0730 12/17/11 0345  WBC 15.4* 19.2*  HGB 16.9 18.8*  HCT 43.4 51.2  PLT 195 237  APTT -- --  CREATININE 1.01 1.19  LABCREA -- --  CREATININE 1.01 1.19  CREAT24HRUR -- --  MG -- --  PHOS -- --  ALBUMIN 3.7 --  PROT 7.3 --  ALBUMIN 3.7 --  AST 25 --  ALT 13 --  ALKPHOS 87 --  BILITOT 0.4 --  BILIDIR -- --  IBILI -- --   Estimated Creatinine Clearance: 84.5 ml/min (by C-G formula based on Cr of 1.01).   Microbiology: No results found for this or any previous visit (from the past 720 hour(s)).  Medical History: Past Medical History  Diagnosis Date  . Seizures     Medications:  Scheduled:    . levetiracetam  500 mg Intravenous Once  . LORazepam  4 mg Intravenous Once  . phenytoin (DILANTIN) IV  1,000 mg Intravenous Once  . phenytoin (DILANTIN) IV  100 mg Intravenous Q8H  . potassium chloride  10 mEq Intravenous Q1 Hr x 3  . sodium chloride  3 mL Intravenous Q12H    Assessment: Patient with hx of seizures and currently post ictal.  Pt noted to have compliance issues.  Phenytoin level drawn but not expected back soon.  Spoke with Dr. Isidoro Donning and wanted to load and dose phenytoin now due to compliance issues and recent seizures.  Dr. Isidoro Donning indicated that 100mg  tid was prior dose and wishes to have IV given patients current post ictal state.  Goal of Therapy:  Phenytoin level 10-20  Plan:  Load with  1000mg  iv phenytoin, then begin 100mg  iv q8hr, will consider change to PO as soon as needed.  follow up with levels  Darlina Guys, Lavonia Eager Crowford 12/17/2011,10:14 AM

## 2011-12-17 NOTE — ED Provider Notes (Addendum)
History     CSN: [id number]  Arrival date & time 12/17/11  0348   First MD Initiated Contact with Patient 12/17/11 [id number]      Chief Complaint  Patient presents with  . Seizure     (Consider location/radiation/quality/duration/timing/severity/associated sxs/prior treatment) HPI This is a 26 year old male with a history of a seizure disorder on Dilantin. The family member found him seizing in his bed about an hour prior to arrival. His seizure lasted about an hour and abated just about the time they got to the ED. He was brought by his family member her private vehicle. During initial nursing assessment he began seizing again. He was placed on supplemental oxygen, and IV was started and he was given 4 mg of IV Ativan with cessation of the seizure. It is noted that he is bitten his tongue. He has had no vomiting. It is not known if he has had any recent alcohol or drug use. It is not known if he has been taking his Dilantin.  Past Medical History  Diagnosis Date  . Seizures     History reviewed. No pertinent past surgical history.  History reviewed. No pertinent family history.  History  Substance Use Topics  . Smoking status: Never Smoker   . Smokeless tobacco: Never Used  . Alcohol Use: No      Review of Systems  Unable to perform ROS   Allergies  Review of patient's allergies indicates no known allergies.  Home Medications   Current Outpatient Rx  Name Route Sig Dispense Refill  . PHENYTOIN SODIUM EXTENDED 100 MG PO CAPS Oral Take 1 capsule (100 mg total) by mouth 3 (three) times daily. 90 capsule 10    BP 166/108  Pulse 116  Temp(Src) 98.7 F (37.1 C) (Rectal)  Resp 22  SpO2 100%  Physical Exam General: Well-developed, well-nourished male; appearance consistent with age of record HENT: normocephalic, jaw clench; laceration to tongue Eyes: Pupils equal round and sluggish; wandering gaze; conjunctival injection bilaterally Neck: supple Heart: regular rate  and rhythm; tachycardia Lungs: Transmitted upper airway sounds; tachypnea Abdomen: soft; nondistended Extremities: No deformity; full range of motion; pulses normal Neurologic: Generalized tonic-clonic seizure  Skin: Warm and dry     ED Course  Procedures (including critical care time)  Left nasal trumpet placed after lubrication with surgical lube. Air movement improved after placement.  CRITICAL CARE Performed by: Paula Libra L   Total critical care time: 45 minutes  Critical care time was exclusive of separately billable procedures and treating other patients.  Critical care was necessary to treat or prevent imminent or life-threatening deterioration.  Critical care was time spent personally by me on the following activities: development of treatment plan with patient and/or surrogate as well as nursing, discussions with consultants, evaluation of patient's response to treatment, examination of patient, obtaining history from patient or surrogate, ordering and performing treatments and interventions, ordering and review of laboratory studies, ordering and review of radiographic studies, pulse oximetry and re-evaluation of patient's condition.   MDM   Nursing notes and vitals signs, including pulse oximetry, reviewed.  Summary of this visit's results, reviewed by myself:  Labs:  Results for orders placed during the hospital encounter of 12/17/11  BLOOD GAS, VENOUS      Component Value Range   FIO2 1.00     Delivery systems NON-REBREATHER OXYGEN MASK     pH, Ven 7.226 (*) 7.250 - 7.300    pCO2, Ven 41.1 (*) 45.0 - 50.0 (mmHg)  pO2, Ven 58.7 (*) 30.0 - 45.0 (mmHg)   Bicarbonate 16.4 (*) 20.0 - 24.0 (mEq/L)   TCO2 14.5  0 - 100 (mmol/L)   Acid-base deficit 10.8 (*) 0.0 - 2.0 (mmol/L)   O2 Saturation 87.1     Patient temperature 98.6     Collection site VEIN     Drawn by COLLECTED BY LABORATORY     Sample type VENOUS    BASIC METABOLIC PANEL      Component Value  Range   Sodium 144  135 - 145 (mEq/L)   Potassium 3.7  3.5 - 5.1 (mEq/L)   Chloride 98  96 - 112 (mEq/L)   CO2 <7 (*) 19 - 32 (mEq/L)   Glucose, Bld 179 (*) 70 - 99 (mg/dL)   BUN 10  6 - 23 (mg/dL)   Creatinine, Ser 1.61  0.50 - 1.35 (mg/dL)   Calcium 09.6 (*) 8.4 - 10.5 (mg/dL)   GFR calc non Af Amer 82 (*) >90 (mL/min)   GFR calc Af Amer >90  >90 (mL/min)  CBC      Component Value Range   WBC 19.2 (*) 4.0 - 10.5 (K/uL)   RBC 5.83 (*) 4.22 - 5.81 (MIL/uL)   Hemoglobin 18.8 (*) 13.0 - 17.0 (g/dL)   HCT 04.5  40.9 - 81.1 (%)   MCV 87.8  78.0 - 100.0 (fL)   MCH 32.2  26.0 - 34.0 (pg)   MCHC 36.7 (*) 30.0 - 36.0 (g/dL)   RDW 91.4  78.2 - 95.6 (%)   Platelets PENDING  150 - 400 (K/uL)  DIFFERENTIAL      Component Value Range   Neutrophils Relative 26 (*) 43 - 77 (%)   Lymphocytes Relative 61 (*) 12 - 46 (%)   Monocytes Relative 9  3 - 12 (%)   Eosinophils Relative 3  0 - 5 (%)   Basophils Relative 1  0 - 1 (%)   Neutro Abs 5.0  1.7 - 7.7 (K/uL)   Lymphs Abs 11.7 (*) 0.7 - 4.0 (K/uL)   Monocytes Absolute 1.7 (*) 0.1 - 1.0 (K/uL)   Eosinophils Absolute 0.6  0.0 - 0.7 (K/uL)   Basophils Absolute 0.2 (*) 0.0 - 0.1 (K/uL)   RBC Morphology TEARDROP CELLS     WBC Morphology ABSOLUTE LYMPHOCYTOSIS    URINE RAPID DRUG SCREEN (HOSP PERFORMED)      Component Value Range   Opiates NONE DETECTED  NONE DETECTED    Cocaine NONE DETECTED  NONE DETECTED    Benzodiazepines NONE DETECTED  NONE DETECTED    Amphetamines NONE DETECTED  NONE DETECTED    Tetrahydrocannabinol NONE DETECTED  NONE DETECTED    Barbiturates NONE DETECTED  NONE DETECTED   ETHANOL      Component Value Range   Alcohol, Ethyl (B) <11  0 - 11 (mg/dL)  GLUCOSE, CAPILLARY      Component Value Range   Glucose-Capillary 173 (*) 70 - 99 (mg/dL)    Imaging Studies: No results found.    EKG Interpretation:  Date & Time: 12/17/2011 3:52 AM  Rate: 130  Rhythm: sinus tachycardia  QRS Axis: normal  Intervals: normal   ST/T Wave abnormalities: normal  Conduction Disutrbances:none  Narrative Interpretation: Left ventricular hypertrophy  Old EKG Reviewed: Rate is faster  5:55 AM Patient has been stable but still very postictal. Patient was loaded with Keppra 500 mg IV.  6:23AM Hospitalist to admit patient to stepdown.      Hanley Seamen, MD  12/17/11 0556  Carlisle Beers Zianne Schubring, MD 12/17/11 [phone]

## 2011-12-17 NOTE — Progress Notes (Signed)
Patient ID: Douglas Rivas  male  ZOX:[id number]    DOB: 1985-12-16    DOA: 12/17/2011  PCP: No primary provider on file.  Subjective: Patient seen and examined the earlier this morning, and in post ictal state  Objective: Weight change:   Intake/Output Summary (Last 24 hours) at 12/17/11 1412 Last data filed at 12/17/11 1104  Gross per 24 hour  Intake   1350 ml  Output   1450 ml  Net   -100 ml   Blood pressure 127/73, pulse 100, temperature 98.8 F (37.1 C), temperature source Rectal, resp. rate 23, height 5\' 2"  (1.575 m), weight 54.432 kg (120 lb), SpO2 100.00%.  Physical Exam: General: Lethargic, groggy in post ictal state  HEENT: anicteric sclera, pupils reactive to light and accommodation, EOMI CVS: S1-S2 clear, no murmur rubs or gallops Chest: clear to auscultation bilaterally, no wheezing, rales or rhonchi Abdomen: soft nontender, nondistended, normal bowel sounds, no organomegaly Extremities: no cyanosis, clubbing or edema noted bilaterally Neuro: Unable to assess, patient lethargic   Lab Results: Basic Metabolic Panel:  Lab 12/17/11 8119 12/17/11 0345  NA 136 144  K 3.2* 3.7  CL 105 98  CO2 21 <7*  GLUCOSE 97 179*  BUN 9 10  CREATININE 1.01 1.19  CALCIUM 7.7* 10.8*  MG -- --  PHOS -- --   Liver Function Tests:  Lab 12/17/11 0730  AST 25  ALT 13  ALKPHOS 87  BILITOT 0.4  PROT 7.3  ALBUMIN 3.7   CBC:  Lab 12/17/11 0730 12/17/11 0345  WBC 15.4* 19.2*  NEUTROABS 13.5* --  HGB 16.9 18.8*  HCT 43.4 51.2  MCV 83.5 87.8  PLT 195 237   Cardiac Enzymes: No results found for this basename: CKTOTAL:3,CKMB:3,CKMBINDEX:3,TROPONINI:3 in the last 168 hours BNP: No components found with this basename: POCBNP:2 CBG:  Lab 12/17/11 0751 12/17/11 0431  GLUCAP 95 173*     Micro Results: Recent Results (from the past 240 hour(s))  MRSA PCR SCREENING     Status: Normal   Collection Time   12/17/11 11:49 AM      Component Value Range Status Comment   MRSA  by PCR NEGATIVE  NEGATIVE  Final     Studies/Results: Ct Head Wo Contrast  12/17/2011  *RADIOLOGY REPORT*  Clinical Data: Seizures.  CT HEAD WITHOUT CONTRAST  Technique:  Contiguous axial images were obtained from the base of the skull through the vertex without contrast.  Comparison: MRI brain 07/31/2011.  Findings: No acute infarct, hemorrhage, mass lesion is present. The ventricles are of normal size.  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  No significant extra-axial fluid collection is present.  A nasal airway is in place.  The maxillary sinuses are small bilaterally.  The paranasal sinuses and mastoid air cells are clear.  The osseous skull is intact.  IMPRESSION:  1.  Normal CT appearance of the brain. 2.  Small bilateral maxillary sinuses, compatible with a history of chronic disease.  Original Report Authenticated By: Jamesetta Orleans. MATTERN, M.D.   Dg Chest Port 1 View  12/17/2011  *RADIOLOGY REPORT*  Clinical Data: Seizure.  Rule out aspiration.  PORTABLE CHEST - 1 VIEW  Comparison: 07/30/2011.  Findings: Heart size is normal.  Mild bronchitic changes are evident.  No focal airspace disease evident.  The visualized soft tissues and bony thorax are unremarkable.  IMPRESSION:  1.  Mild perihilar bronchitic changes. Acute bronchitis is not excluded. 2.  No evidence for airspace consolidation or  aspiration.  The  Original Report Authenticated By: Jamesetta Orleans. MATTERN, M.D.    Medications: Scheduled Meds:   . levetiracetam  500 mg Intravenous Once  . LORazepam  4 mg Intravenous Once  . phenytoin (DILANTIN) IV  1,000 mg Intravenous Once  . phenytoin (DILANTIN) IV  100 mg Intravenous Q8H  . potassium chloride  10 mEq Intravenous Q1 Hr x 3  . sodium chloride  3 mL Intravenous Q12H   Continuous Infusions:   . sodium chloride 125 mL/hr at 12/17/11 0845     Assessment/Plan: Principal Problem:   *Status epilepticus - Admit to step down for 24 hours for close  monitoring, admission Dilantin level extremely low likely secondary to noncompliance - Loaded with IV Keppra in the ER, started on IV Dilantin after loading dose, Dilantin level in a.m. - Neurology consultation, discussed with Dr. Michaelene Song, recommended to stop Keppra after Dilantin level therapeutic and continue Dilantin alone, seizure precautions, CT head negative  - Patient will need PCP and case management resources for prescriptions   Acute metabolic acidosis: Likely secondary to status epilepticus, improving - Continue IV fluid hydration    Leukocytosis: Improving, chest x-ray negative for any acute aspiration pneumonitis or pneumonia  Hypokalemia: Replaced   DVT Prophylaxis: SCDs   Code Status:  Disposition: monitor in SDU for next 24hours     LOS: 0 days   Valerie Fredin M.D. [facility] 12/17/2011, 2:12 PM Pager: [phone]

## 2011-12-17 NOTE — H&P (Signed)
Douglas Rivas is an 26 y.o. male.   PCP - Not known. Chief Complaint: Seizures. HPI: 26 year old male with known history of seizures who was last admitted here in January 2013 with status epilepticus was brought into the ER because of seizures. As per patient's family who was by his side patient had seizures continuously for at least one hour before comming. While in the ER patient had another seizure for one half minute and had received at least 4 mg of IV Ativan and 500 mg of IV Keppra. After which his seizures have stopped. He is personally post ictal. He does not provide any history and by the time I could examine the patient the family member who was at the bedside has left. The number provided to contact was his stepfather's number and he does not know much about the patient. Patient will be admitted for seizures with status epilepticus most likely from noncompliance with medications. His last prescription refill on the bottle is by Dr.Abrol who had discharged him last January 2013.  Past Medical History  Diagnosis Date  . Seizures     History reviewed. No pertinent past surgical history.  History reviewed. No pertinent family history. Social History:  reports that he has never smoked. He has never used smokeless tobacco. He reports that he does not drink alcohol or use illicit drugs.  Allergies: No Known Allergies   (Not in a hospital admission)  Results for orders placed during the hospital encounter of 12/17/11 (from the past 48 hour(s))  BASIC METABOLIC PANEL     Status: Abnormal   Collection Time   12/17/11  3:45 AM      Component Value Range Comment   Sodium 144  135 - 145 (mEq/L)    Potassium 3.7  3.5 - 5.1 (mEq/L)    Chloride 98  96 - 112 (mEq/L)    CO2 <7 (*) 19 - 32 (mEq/L)    Glucose, Bld 179 (*) 70 - 99 (mg/dL)    BUN 10  6 - 23 (mg/dL)    Creatinine, Ser 1.61  0.50 - 1.35 (mg/dL)    Calcium 09.6 (*) 8.4 - 10.5 (mg/dL)    GFR calc non Af Amer 82 (*) >90 (mL/min)      GFR calc Af Amer >90  >90 (mL/min)   CBC     Status: Abnormal (Preliminary result)   Collection Time   12/17/11  3:45 AM      Component Value Range Comment   WBC 19.2 (*) 4.0 - 10.5 (K/uL) REPEATED TO VERIFY   RBC 5.83 (*) 4.22 - 5.81 (MIL/uL)    Hemoglobin 18.8 (*) 13.0 - 17.0 (g/dL) REPEATED TO VERIFY   HCT 51.2  39.0 - 52.0 (%)    MCV 87.8  78.0 - 100.0 (fL)    MCH 32.2  26.0 - 34.0 (pg)    MCHC 36.7 (*) 30.0 - 36.0 (g/dL)    RDW 04.5  40.9 - 81.1 (%)    Platelets PENDING  150 - 400 (K/uL)   DIFFERENTIAL     Status: Abnormal   Collection Time   12/17/11  3:45 AM      Component Value Range Comment   Neutrophils Relative 26 (*) 43 - 77 (%)    Lymphocytes Relative 61 (*) 12 - 46 (%)    Monocytes Relative 9  3 - 12 (%)    Eosinophils Relative 3  0 - 5 (%)    Basophils Relative 1  0 - 1 (%)  Neutro Abs 5.0  1.7 - 7.7 (K/uL)    Lymphs Abs 11.7 (*) 0.7 - 4.0 (K/uL)    Monocytes Absolute 1.7 (*) 0.1 - 1.0 (K/uL)    Eosinophils Absolute 0.6  0.0 - 0.7 (K/uL)    Basophils Absolute 0.2 (*) 0.0 - 0.1 (K/uL)    RBC Morphology TEARDROP CELLS      WBC Morphology ABSOLUTE LYMPHOCYTOSIS   ATYPICAL LYMPHOCYTES  ETHANOL     Status: Normal   Collection Time   12/17/11  3:45 AM      Component Value Range Comment   Alcohol, Ethyl (B) <11  0 - 11 (mg/dL)   URINE RAPID DRUG SCREEN (HOSP PERFORMED)     Status: Normal   Collection Time   12/17/11  4:04 AM      Component Value Range Comment   Opiates NONE DETECTED  NONE DETECTED     Cocaine NONE DETECTED  NONE DETECTED     Benzodiazepines NONE DETECTED  NONE DETECTED     Amphetamines NONE DETECTED  NONE DETECTED     Tetrahydrocannabinol NONE DETECTED  NONE DETECTED     Barbiturates NONE DETECTED  NONE DETECTED    GLUCOSE, CAPILLARY     Status: Abnormal   Collection Time   12/17/11  4:31 AM      Component Value Range Comment   Glucose-Capillary 173 (*) 70 - 99 (mg/dL)   BLOOD GAS, VENOUS     Status: Abnormal   Collection Time   12/17/11  5:00 AM       Component Value Range Comment   FIO2 1.00      Delivery systems NON-REBREATHER OXYGEN MASK      pH, Ven 7.226 (*) 7.250 - 7.300     pCO2, Ven 41.1 (*) 45.0 - 50.0 (mmHg)    pO2, Ven 58.7 (*) 30.0 - 45.0 (mmHg)    Bicarbonate 16.4 (*) 20.0 - 24.0 (mEq/L)    TCO2 14.5  0 - 100 (mmol/L)    Acid-base deficit 10.8 (*) 0.0 - 2.0 (mmol/L)    O2 Saturation 87.1      Patient temperature 98.6      Collection site VEIN      Drawn by COLLECTED BY LABORATORY      Sample type VENOUS      No results found.  Review of Systems  Constitutional: Negative.   HENT: Negative.   Eyes: Negative.   Respiratory: Negative.   Cardiovascular: Negative.   Gastrointestinal: Negative.   Genitourinary: Negative.   Musculoskeletal: Negative.   Skin: Negative.   Neurological: Positive for seizures.  Endo/Heme/Allergies: Negative.   Psychiatric/Behavioral: Negative.     Blood pressure 166/108, pulse 116, temperature 98.7 F (37.1 C), temperature source Rectal, resp. rate 22, SpO2 100.00%. Physical Exam  Constitutional: He appears well-developed and well-nourished. No distress.  HENT:  Head: Normocephalic and atraumatic.  Right Ear: External ear normal.  Left Ear: External ear normal.  Eyes: Conjunctivae are normal. Pupils are equal, round, and reactive to light. Right eye exhibits no discharge. Left eye exhibits no discharge. No scleral icterus.  Neck: Normal range of motion. Neck supple.  Cardiovascular: Normal rate and regular rhythm.   Respiratory: Effort normal and breath sounds normal. No respiratory distress. He has no wheezes. He has no rales.  GI: Soft. Bowel sounds are normal. He exhibits no distension. There is no tenderness. There is no rebound.  Musculoskeletal: Normal range of motion. He exhibits no edema and no tenderness.  Neurological:  Minimally responsive.  Skin: Skin is warm and dry. He is not diaphoretic.     Assessment/Plan #1. Status post status epilepticus most  likely from noncompliance with his medications - Dilantin level is still pending. I have asked pharmacy to dose and manage the levels of Dilantin. Patient will be placed on when necessary Ativan for any seizures. Patient on seizure precautions. CT head is pending. Chest x-ray is also ordered. #2. Encephalopathy from post ictal state. #3. Metabolic acidosis and leukocytosis most likely from seizures - at this time hydrating. Patient is afebrile. Closely follow his metabolic panel and CBC. His platelet count is still pending.  Once his family is available we need to get further history.  CODE STATUS - full code.  Eduard Clos 12/17/2011, 6:47 AM

## 2011-12-17 NOTE — ED Notes (Signed)
Pt dropped off at front door ED. Friends state he has been seizing for a hour. Pt actively seizing upon arrival to room. Pt given 4 mg of Ativan at 0345. Per charge RN pt has a 1.5 min seizure in the room. MD at bedside upon pt entering the room

## 2011-12-17 NOTE — Consult Note (Signed)
Reason for Consult:Seizure disorder Referring Physician: Rai  CC: Status epilepticus  HPI: Douglas Rivas is an 26 y.o. male with a history of seizure disorder who presented after having had seizure activity for about an hour per family.  Patient was brought to the ED.  IV Ativan and Keppra were given with discontinuation of seizure activity.  Patient's mental status has been slowly improving.  No further seizure activity has been noted.   Much of history is given by the mother.  She reports that the patient was a normal pregnancy and delivery for her.  He began to have seizures as a child.  They went away for a time and then returned.  He does have seizures multiple times per year.  After review of the chart it seems that on most occasions this is related to noncompliance.  The patient does not have insurance.  He reports getting his medication filled last night.  Mother reports compliance with medications but he lives with his older brother and not with her.  He does not have a PCP per her report and gets his refills when he is hospitalized.  Past Medical History  Diagnosis Date  . Seizures     History reviewed. No pertinent past surgical history.  Family history:  Mother had seizures as a child  Social History:  reports that he has never smoked. He has never used smokeless tobacco. He reports that he does not drink alcohol or use illicit drugs.  No Known Allergies  Medications:  I have reviewed the patient's current medications. Prior to Admission:  Prescriptions prior to admission  Medication Sig Dispense Refill  . phenytoin (DILANTIN) 100 MG ER capsule Take 1 capsule (100 mg total) by mouth 3 (three) times daily.  90 capsule  10   Scheduled:   . levetiracetam  500 mg Intravenous Once  . LORazepam  4 mg Intravenous Once  . phenytoin (DILANTIN) IV  1,000 mg Intravenous Once  . phenytoin (DILANTIN) IV  100 mg Intravenous Q8H  . potassium chloride  10 mEq Intravenous Q1 Hr x 3    . sodium chloride  3 mL Intravenous Q12H    ROS: Unable to obtain  Physical Examination: Blood pressure 127/73, pulse 100, temperature 98.8 F (37.1 C), temperature source Rectal, resp. rate 23, height 5\' 2"  (1.575 m), weight 54.432 kg (120 lb), SpO2 100.00%.  Neurologic Examination Mental Status: Spoken to through mother who acts as an interpreter.  Alert.  Difficulty following 3-step commands Cranial Nerves: II: visual fields grossly normal, pupils equal, round, reactive to light and accommodation III,IV, VI: ptosis not present, extra-ocular motions intact bilaterally V,VII: decreased left NLF, facial light touch sensation normal bilaterally VIII: hearing normal bilaterally IX,X: gag reflex present XI: trapezius strength/neck flexion strength normal bilaterally XII: tongue strength normal  Motor: Right : Upper extremity   5/5    Left:     Upper extremity   5/5  Lower extremity   5/5     Lower extremity   5/5 Tone and bulk:normal tone throughout; no atrophy noted Sensory: Pinprick and light touch intact throughout, bilaterally Deep Tendon Reflexes: 2+ and symmetric throughout Plantars: Right: downgoing   Left: downgoing Cerebellar: normal finger-to-nose and normal heel-to-shin test   Results for orders placed during the hospital encounter of 12/17/11 (from the past 48 hour(s))  PHENYTOIN LEVEL, TOTAL     Status: Abnormal   Collection Time   12/17/11  3:45 AM      Component Value Range Comment  Phenytoin Lvl <2.5 (*) 10.0 - 20.0 (ug/mL)   BASIC METABOLIC PANEL     Status: Abnormal   Collection Time   12/17/11  3:45 AM      Component Value Range Comment   Sodium 144  135 - 145 (mEq/L)    Potassium 3.7  3.5 - 5.1 (mEq/L)    Chloride 98  96 - 112 (mEq/L)    CO2 <7 (*) 19 - 32 (mEq/L)    Glucose, Bld 179 (*) 70 - 99 (mg/dL)    BUN 10  6 - 23 (mg/dL)    Creatinine, Ser 1.61  0.50 - 1.35 (mg/dL)    Calcium 09.6 (*) 8.4 - 10.5 (mg/dL)    GFR calc non Af Amer 82 (*) >90  (mL/min)    GFR calc Af Amer >90  >90 (mL/min)   CBC     Status: Abnormal   Collection Time   12/17/11  3:45 AM      Component Value Range Comment   WBC 19.2 (*) 4.0 - 10.5 (K/uL) REPEATED TO VERIFY   RBC 5.83 (*) 4.22 - 5.81 (MIL/uL)    Hemoglobin 18.8 (*) 13.0 - 17.0 (g/dL) REPEATED TO VERIFY   HCT 51.2  39.0 - 52.0 (%)    MCV 87.8  78.0 - 100.0 (fL)    MCH 32.2  26.0 - 34.0 (pg)    MCHC 36.7 (*) 30.0 - 36.0 (g/dL)    RDW 04.5  40.9 - 81.1 (%)    Platelets 237  150 - 400 (K/uL)   DIFFERENTIAL     Status: Abnormal   Collection Time   12/17/11  3:45 AM      Component Value Range Comment   Neutrophils Relative 26 (*) 43 - 77 (%)    Lymphocytes Relative 61 (*) 12 - 46 (%)    Monocytes Relative 9  3 - 12 (%)    Eosinophils Relative 3  0 - 5 (%)    Basophils Relative 1  0 - 1 (%)    Neutro Abs 5.0  1.7 - 7.7 (K/uL)    Lymphs Abs 11.7 (*) 0.7 - 4.0 (K/uL)    Monocytes Absolute 1.7 (*) 0.1 - 1.0 (K/uL)    Eosinophils Absolute 0.6  0.0 - 0.7 (K/uL)    Basophils Absolute 0.2 (*) 0.0 - 0.1 (K/uL)    RBC Morphology TEARDROP CELLS      WBC Morphology ABSOLUTE LYMPHOCYTOSIS   ATYPICAL LYMPHOCYTES  ETHANOL     Status: Normal   Collection Time   12/17/11  3:45 AM      Component Value Range Comment   Alcohol, Ethyl (B) <11  0 - 11 (mg/dL)   URINE RAPID DRUG SCREEN (HOSP PERFORMED)     Status: Normal   Collection Time   12/17/11  4:04 AM      Component Value Range Comment   Opiates NONE DETECTED  NONE DETECTED     Cocaine NONE DETECTED  NONE DETECTED     Benzodiazepines NONE DETECTED  NONE DETECTED     Amphetamines NONE DETECTED  NONE DETECTED     Tetrahydrocannabinol NONE DETECTED  NONE DETECTED     Barbiturates NONE DETECTED  NONE DETECTED    GLUCOSE, CAPILLARY     Status: Abnormal   Collection Time   12/17/11  4:31 AM      Component Value Range Comment   Glucose-Capillary 173 (*) 70 - 99 (mg/dL)   BLOOD GAS, VENOUS     Status: Abnormal  Collection Time   12/17/11  5:00 AM      Component  Value Range Comment   FIO2 1.00      Delivery systems NON-REBREATHER OXYGEN MASK      pH, Ven 7.226 (*) 7.250 - 7.300     pCO2, Ven 41.1 (*) 45.0 - 50.0 (mmHg)    pO2, Ven 58.7 (*) 30.0 - 45.0 (mmHg)    Bicarbonate 16.4 (*) 20.0 - 24.0 (mEq/L)    TCO2 14.5  0 - 100 (mmol/L)    Acid-base deficit 10.8 (*) 0.0 - 2.0 (mmol/L)    O2 Saturation 87.1      Patient temperature 98.6      Collection site VEIN      Drawn by COLLECTED BY LABORATORY      Sample type VENOUS     COMPREHENSIVE METABOLIC PANEL     Status: Abnormal   Collection Time   12/17/11  7:30 AM      Component Value Range Comment   Sodium 136  135 - 145 (mEq/L)    Potassium 3.2 (*) 3.5 - 5.1 (mEq/L)    Chloride 105  96 - 112 (mEq/L)    CO2 21  19 - 32 (mEq/L)    Glucose, Bld 97  70 - 99 (mg/dL)    BUN 9  6 - 23 (mg/dL)    Creatinine, Ser 4.09  0.50 - 1.35 (mg/dL)    Calcium 7.7 (*) 8.4 - 10.5 (mg/dL)    Total Protein 7.3  6.0 - 8.3 (g/dL)    Albumin 3.7  3.5 - 5.2 (g/dL)    AST 25  0 - 37 (U/L)    ALT 13  0 - 53 (U/L)    Alkaline Phosphatase 87  39 - 117 (U/L)    Total Bilirubin 0.4  0.3 - 1.2 (mg/dL)    GFR calc non Af Amer >90  >90 (mL/min)    GFR calc Af Amer >90  >90 (mL/min)   CBC     Status: Abnormal   Collection Time   12/17/11  7:30 AM      Component Value Range Comment   WBC 15.4 (*) 4.0 - 10.5 (K/uL)    RBC 5.20  4.22 - 5.81 (MIL/uL)    Hemoglobin 16.9  13.0 - 17.0 (g/dL)    HCT 81.1  91.4 - 78.2 (%)    MCV 83.5  78.0 - 100.0 (fL)    MCH 32.5  26.0 - 34.0 (pg)    MCHC 38.9 (*) 30.0 - 36.0 (g/dL)    RDW 95.6  21.3 - 08.6 (%)    Platelets 195  150 - 400 (K/uL)   DIFFERENTIAL     Status: Abnormal   Collection Time   12/17/11  7:30 AM      Component Value Range Comment   Neutrophils Relative 88 (*) 43 - 77 (%)    Neutro Abs 13.5 (*) 1.7 - 7.7 (K/uL)    Lymphocytes Relative 4 (*) 12 - 46 (%)    Lymphs Abs 0.6 (*) 0.7 - 4.0 (K/uL)    Monocytes Relative 8  3 - 12 (%)    Monocytes Absolute 1.3 (*) 0.1 - 1.0  (K/uL)    Eosinophils Relative 0  0 - 5 (%)    Eosinophils Absolute 0.0  0.0 - 0.7 (K/uL)    Basophils Relative 0  0 - 1 (%)    Basophils Absolute 0.0  0.0 - 0.1 (K/uL)   GLUCOSE, CAPILLARY  Status: Normal   Collection Time   12/17/11  7:51 AM      Component Value Range Comment   Glucose-Capillary 95  70 - 99 (mg/dL)    Comment 1 Notify RN      Comment 2 Documented in Chart     BLOOD GAS, ARTERIAL     Status: Abnormal   Collection Time   12/17/11  8:17 AM      Component Value Range Comment   FIO2 0.28      Delivery systems NASAL CANNULA      pH, Arterial 7.389  7.350 - 7.450     pCO2 arterial 34.9 (*) 35.0 - 45.0 (mmHg)    pO2, Arterial 152.0 (*) 80.0 - 100.0 (mmHg)    Bicarbonate 20.6  20.0 - 24.0 (mEq/L)    TCO2 17.5  0 - 100 (mmol/L)    Acid-base deficit 3.1 (*) 0.0 - 2.0 (mmol/L)    O2 Saturation 99.4      Patient temperature 98.6      Collection site RIGHT RADIAL      Drawn by [id number]      Sample type ARTERIAL DRAW      Allens test (pass/fail) PASS  PASS    MRSA PCR SCREENING     Status: Normal   Collection Time   12/17/11 11:49 AM      Component Value Range Comment   MRSA by PCR NEGATIVE  NEGATIVE      Recent Results (from the past 240 hour(s))  MRSA PCR SCREENING     Status: Normal   Collection Time   12/17/11 11:49 AM      Component Value Range Status Comment   MRSA by PCR NEGATIVE  NEGATIVE  Final     Ct Head Wo Contrast  12/17/2011  *RADIOLOGY REPORT*  Clinical Data: Seizures.  CT HEAD WITHOUT CONTRAST  Technique:  Contiguous axial images were obtained from the base of the skull through the vertex without contrast.  Comparison: MRI brain 07/31/2011.  Findings: No acute infarct, hemorrhage, mass lesion is present. The ventricles are of normal size.  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  No significant extra-axial fluid collection is present.  A nasal airway is in place.  The maxillary sinuses are small bilaterally.  The paranasal sinuses and  mastoid air cells are clear.  The osseous skull is intact.  IMPRESSION:  1.  Normal CT appearance of the brain. 2.  Small bilateral maxillary sinuses, compatible with a history of chronic disease.  Original Report Authenticated By: Jamesetta Orleans. MATTERN, M.D.   Dg Chest Port 1 View  12/17/2011  *RADIOLOGY REPORT*  Clinical Data: Seizure.  Rule out aspiration.  PORTABLE CHEST - 1 VIEW  Comparison: 07/30/2011.  Findings: Heart size is normal.  Mild bronchitic changes are evident.  No focal airspace disease evident.  The visualized soft tissues and bony thorax are unremarkable.  IMPRESSION:  1.  Mild perihilar bronchitic changes. Acute bronchitis is not excluded. 2.  No evidence for airspace consolidation or aspiration.  The  Original Report Authenticated By: Jamesetta Orleans. MATTERN, M.D.     Assessment/Plan:  Patient Active Hospital Problem List: Status epilepticus (07/30/2011)   Assessment: Presenting Dilantin level subtherapeutic and again suspect that patient has been noncompliant.  Dilantin has been restarted as well as Keppra which has been added.   Plan:  1.  Dilantin level in AM  2.  Once Dilantin therapeutic would stop Keppra and continue Dilantin alone.  3.  Once able to take po would change meds to po administration.  4. Seizure precautions    Thana Farr, MD [facility] [phone] 12/17/2011, 1:59 PM

## 2011-12-18 DIAGNOSIS — G40401 Other generalized epilepsy and epileptic syndromes, not intractable, with status epilepticus: Secondary | ICD-10-CM

## 2011-12-18 DIAGNOSIS — I428 Other cardiomyopathies: Secondary | ICD-10-CM

## 2011-12-18 DIAGNOSIS — M6282 Rhabdomyolysis: Secondary | ICD-10-CM

## 2011-12-18 DIAGNOSIS — E872 Acidosis: Secondary | ICD-10-CM

## 2011-12-18 DIAGNOSIS — D7289 Other specified disorders of white blood cells: Secondary | ICD-10-CM

## 2011-12-18 LAB — CBC
MCHC: 38.5 g/dL — ABNORMAL HIGH (ref 30.0–36.0)
Platelets: 194 10*3/uL (ref 150–400)
RDW: 12.5 % (ref 11.5–15.5)
WBC: 9.6 10*3/uL (ref 4.0–10.5)

## 2011-12-18 LAB — GLUCOSE, CAPILLARY

## 2011-12-18 LAB — CARDIAC PANEL(CRET KIN+CKTOT+MB+TROPI)
CK, MB: 2.6 ng/mL (ref 0.3–4.0)
CK, MB: 3 ng/mL (ref 0.3–4.0)
Relative Index: 0.2 (ref 0.0–2.5)
Total CK: 1118 U/L — ABNORMAL HIGH (ref 7–232)
Total CK: 2131 U/L — ABNORMAL HIGH (ref 7–232)
Troponin I: <0.3 ng/mL (ref <0.30)

## 2011-12-18 LAB — BASIC METABOLIC PANEL
BUN: 5 mg/dL — ABNORMAL LOW (ref 6–23)
Calcium: 8.6 mg/dL (ref 8.4–10.5)
Creatinine, Ser: 0.92 mg/dL (ref 0.50–1.35)
GFR calc Af Amer: >90 mL/min (ref >90)
GFR calc non Af Amer: >90 mL/min (ref >90)

## 2011-12-18 LAB — PHENYTOIN LEVEL, TOTAL: Phenytoin Lvl: 24.9 ug/mL — ABNORMAL HIGH (ref 10.0–20.0)

## 2011-12-18 MED ORDER — PHENYTOIN 50 MG PO CHEW
100.0000 mg | CHEWABLE_TABLET | Freq: Three times a day (TID) | ORAL | Status: DC
  Filled 2011-12-18 (×3): qty 2

## 2011-12-18 MED ORDER — METOPROLOL TARTRATE 25 MG PO TABS
25.0000 mg | ORAL_TABLET | Freq: Two times a day (BID) | ORAL | Status: DC
  Administered 2011-12-18 – 2011-12-20 (×4): 25 mg via ORAL
  Filled 2011-12-18 (×5): qty 1

## 2011-12-18 MED ORDER — PHENYTOIN SODIUM EXTENDED 100 MG PO CAPS
100.0000 mg | ORAL_CAPSULE | Freq: Three times a day (TID) | ORAL | Status: DC
  Administered 2011-12-18 – 2011-12-19 (×3): 100 mg via ORAL
  Filled 2011-12-18 (×6): qty 1

## 2011-12-18 MED ORDER — METOPROLOL TARTRATE 12.5 MG HALF TABLET
12.5000 mg | ORAL_TABLET | Freq: Two times a day (BID) | ORAL | Status: DC
  Administered 2011-12-18: 12.5 mg via ORAL
  Filled 2011-12-18 (×3): qty 1

## 2011-12-18 NOTE — Progress Notes (Signed)
  Echocardiogram 2D Echocardiogram has been performed.  Douglas Rivas [facility] 12/18/2011, 2:05 PM

## 2011-12-18 NOTE — Progress Notes (Signed)
[facility] NP notified when ST segment alarms began and pt was c/o of L arm pain, around 2240. 12 lead EKG obtained. NP ordered 3 sets cardiac enzymes. First set with abnormal CPK-MB called to [facility] NP. Troponin negative for first set of enzymes. No further orders received at this time.    [facility] NP also notified pt has no GI prophylaxis ordered.  He stated best practice per IP is not to administer GI prophylaxis to all pts due to risk of c diff.  Thus, protonix not ordered at this time. Will report to day RN to discuss possible need with rounding MD; as requested by [facility] NP.

## 2011-12-18 NOTE — Progress Notes (Addendum)
MEDICATION RELATED CONSULT NOTE - Follow Up  Pharmacy Consult for Phenytoin  Indication: seizures  No Known Allergies  Patient Measurements: Height: 5\' 2"  (157.5 cm) Weight: 111 lb 5.3 oz (50.5 kg) IBW/kg (Calculated) : 54.6    Vital Signs: Temp: 97.8 F (36.6 C) (06/09 0800) Temp src: Oral (06/09 0800) BP: 146/79 mmHg (06/09 0800) Pulse Rate: 94  (06/09 0800) Intake/Output from previous day: 06/08 0701 - 06/09 0700 In: 2860 [I.V.:2850; IV Piggyback:10] Out: 4025 [Urine:4025] Intake/Output from this shift: Total I/O In: 260 [I.V.:250; IV Piggyback:10] Out: -   Labs:  Basename 12/18/11 0645 12/17/11 0730 12/17/11 0345  WBC 9.6 15.4* 19.2*  HGB 17.7* 16.9 18.8*  HCT 46.0 43.4 51.2  PLT 194 195 237  APTT -- -- --  CREATININE 0.92 1.01 1.19  LABCREA -- -- --  CREATININE 0.92 1.01 1.19  CREAT24HRUR -- -- --  MG -- -- --  PHOS -- -- --  ALBUMIN -- 3.7 --  PROT -- 7.3 --  ALBUMIN -- 3.7 --  AST -- 25 --  ALT -- 13 --  ALKPHOS -- 87 --  BILITOT -- 0.4 --  BILIDIR -- -- --  IBILI -- -- --   Estimated Creatinine Clearance: 86.1 ml/min (by C-G formula based on Cr of 0.92).   Microbiology: Recent Results (from the past 720 hour(s))  MRSA PCR SCREENING     Status: Normal   Collection Time   12/17/11 11:49 AM      Component Value Range Status Comment   MRSA by PCR NEGATIVE  NEGATIVE  Final     Medical History: Past Medical History  Diagnosis Date  . Seizures     Medications:  Scheduled:     . metoprolol tartrate  12.5 mg Oral BID  . phenytoin (DILANTIN) IV  100 mg Intravenous Q8H  . potassium chloride  10 mEq Intravenous Q1 Hr x 3  . sodium chloride  3 mL Intravenous Q12H    Assessment: Patient with hx of seizures and medication compliance issues.  Pt noted to have active seizures by family. Home dose reportedly phenytoin 100mg  PO TID. Pt was loaded with Dilantin 1g x1 yesterday, then started on phenytoin 100mg  IV q8h. Phenytoin level this am =  24.9  (SCr and albumin wnl) indicating that patient has been adequately loaded with phenytoin. Diet has been advanced to Regular. So far, the only PO med ordered is metoprolol which hasn't been administered yet. If pt tolerates other PO meds, can switch phenytoin to PO in a 1:1 dose fashion. No further seizure activity reported in chart notes.  Goal of Therapy:  Phenytoin level 10-20  Plan:  1) Continue Phenytoin 100mg  IV q8h 2) Once pt able to take/tolerate PO, switch phenytoin to 100mg  PO TID. 3) Suggest rechecking phenytoin level in 5-7 days to assess trend, and again in 14 days to establish a steady-state level (goal level = 10-20).  Darrol Angel, PharmD Pager: [phone] 12/18/2011,9:54 AM

## 2011-12-18 NOTE — Progress Notes (Signed)
Patient ID: Douglas Rivas  male  AVW:[id number]    DOB: 1985-07-23    DOA: 12/17/2011  PCP: No primary provider on file.  Subjective: Patient seen and examined the earlier this morning, alert and awake  Objective: Weight change:   Intake/Output Summary (Last 24 hours) at 12/18/11 1117 Last data filed at 12/18/11 1000  Gross per 24 hour  Intake   1895 ml  Output   3825 ml  Net  -1930 ml   Blood pressure 150/95, pulse 112, temperature 97.8 F (36.6 C), temperature source Oral, resp. rate 20, height 5\' 2"  (1.575 m), weight 50.5 kg (111 lb 5.3 oz), SpO2 99.00%.  Physical Exam: General: Lethargic, groggy in post ictal state  HEENT: anicteric sclera, pupils reactive to light and accommodation, EOMI CVS: S1-S2 clear, no murmur rubs or gallops Chest: clear to auscultation bilaterally, no wheezing, rales or rhonchi Abdomen: soft nontender, nondistended, normal bowel sounds, no organomegaly Extremities: no cyanosis, clubbing or edema noted bilaterally   Lab Results: Basic Metabolic Panel:  Lab 12/18/11 8295 12/17/11 0730  NA 135 136  K 3.9 3.2*  CL 104 105  CO2 22 21  GLUCOSE 67* 97  BUN 5* 9  CREATININE 0.92 1.01  CALCIUM 8.6 7.7*  MG -- --  PHOS -- --   Liver Function Tests:  Lab 12/17/11 0730  AST 25  ALT 13  ALKPHOS 87  BILITOT 0.4  PROT 7.3  ALBUMIN 3.7   CBC:  Lab 12/18/11 0645 12/17/11 0730  WBC 9.6 15.4*  NEUTROABS -- 13.5*  HGB 17.7* 16.9  HCT 46.0 43.4  MCV 85.5 83.5  PLT 194 195   Cardiac Enzymes:  Lab 12/18/11 0645 12/18/11 0008  CKTOTAL 1118* 872*  CKMB 3.0 2.6  CKMBINDEX -- --  TROPONINI <0.30 <0.30   BNP: No components found with this basename: POCBNP:2 CBG:  Lab 12/17/11 2339 12/17/11 1944 12/17/11 1736 12/17/11 0751 12/17/11 0431  GLUCAP 80 113* 69* 95 173*     Micro Results: Recent Results (from the past 240 hour(s))  MRSA PCR SCREENING     Status: Normal   Collection Time   12/17/11 11:49 AM      Component Value Range Status  Comment   MRSA by PCR NEGATIVE  NEGATIVE  Final     Studies/Results: Ct Head Wo Contrast  12/17/2011  *RADIOLOGY REPORT*  Clinical Data: Seizures.  CT HEAD WITHOUT CONTRAST  Technique:  Contiguous axial images were obtained from the base of the skull through the vertex without contrast.  Comparison: MRI brain 07/31/2011.  Findings: No acute infarct, hemorrhage, mass lesion is present. The ventricles are of normal size.  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  No significant extra-axial fluid collection is present.  A nasal airway is in place.  The maxillary sinuses are small bilaterally.  The paranasal sinuses and mastoid air cells are clear.  The osseous skull is intact.  IMPRESSION:  1.  Normal CT appearance of the brain. 2.  Small bilateral maxillary sinuses, compatible with a history of chronic disease.  Original Report Authenticated By: Jamesetta Orleans. MATTERN, M.D.   Dg Chest Port 1 View  12/17/2011  *RADIOLOGY REPORT*  Clinical Data: Seizure.  Rule out aspiration.  PORTABLE CHEST - 1 VIEW  Comparison: 07/30/2011.  Findings: Heart size is normal.  Mild bronchitic changes are evident.  No focal airspace disease evident.  The visualized soft tissues and bony thorax are unremarkable.  IMPRESSION:  1.  Mild perihilar bronchitic  changes. Acute bronchitis is not excluded. 2.  No evidence for airspace consolidation or aspiration.  The  Original Report Authenticated By: Jamesetta Orleans. MATTERN, M.D.    Medications: Scheduled Meds:    . metoprolol tartrate  25 mg Oral BID  . phenytoin (DILANTIN) IV  100 mg Intravenous Q8H  . potassium chloride  10 mEq Intravenous Q1 Hr x 3  . sodium chloride  3 mL Intravenous Q12H  . DISCONTD: metoprolol tartrate  12.5 mg Oral BID   Continuous Infusions:    . sodium chloride 125 mL/hr at 12/18/11 0981     Assessment/Plan: Principal Problem:   *Status epilepticus - No repeat seizures after admission. S/p IV keppra loading dose -  Dilantin level 24.9, will transition to oral dilantin    - Patient will need PCP and case management resources for prescriptions   Rhabdomyolysis: Likely secondary to status epilepticus - Continue aggressive fluid hydration  EKG changes/LVH/HTN: - cardiac enzymes neg, no chest pain, will obtain 2D- echo, start BB  Acute metabolic acidosis: Likely secondary to status epilepticus, resolved - Continue IV fluid hydration    Leukocytosis: resolved chest x-ray negative for any acute aspiration pneumonitis or pneumonia  Hypokalemia: resolved   DVT Prophylaxis: SCDs   Code Status:  Disposition: transfer to floor, DC in AM if no repeat seizures   LOS: 1 day   Sheritta Deeg M.D. [facility] 12/18/2011, 11:17 AM Pager: [phone]

## 2011-12-18 NOTE — Progress Notes (Signed)
Subjective: Patient more alert today.  Does not require an interpreter.  Seizure free overnight.  Objective: Current vital signs: BP 150/95  Pulse 112  Temp(Src) 97.6 F (36.4 C) (Oral)  Resp 20  Ht 5\' 2"  (1.575 m)  Wt 50.5 kg (111 lb 5.3 oz)  BMI 20.36 kg/m2  SpO2 99% Vital signs in last 24 hours: Temp:  [97.6 F (36.4 C)-98.9 F (37.2 C)] 97.6 F (36.4 C) (06/09 1213) Pulse Rate:  [88-112] 112  (06/09 1000) Resp:  [16-24] 20  (06/09 1000) BP: (120-150)/(72-103) 150/95 mmHg (06/09 1000) SpO2:  [98 %-100 %] 99 % (06/09 1000) Weight:  [50.5 kg (111 lb 5.3 oz)] 50.5 kg (111 lb 5.3 oz) (06/09 0400)  Intake/Output from previous day: 06/08 0701 - 06/09 0700 In: 2860 [I.V.:2850; IV Piggyback:10] Out: 4025 [Urine:4025] Intake/Output this shift: Total I/O In: 385 [I.V.:375; IV Piggyback:10] Out: 550 [Urine:550] Nutritional status: General  Neurologic Exam: Mental Status:   Alert. Difficulty following 3-step commands  Cranial Nerves:  II: visual fields grossly normal, pupils equal, round, reactive to light and accommodation  III,IV, VI: ptosis not present, extra-ocular motions intact bilaterally  V,VII: decreased left NLF, facial light touch sensation normal bilaterally  VIII: hearing normal bilaterally  IX,X: gag reflex present  XI: trapezius strength/neck flexion strength normal bilaterally  XII: tongue strength normal  Motor:  Right : Upper extremity 5/5     Left: Upper extremity 5/5   Lower extremity 5/5             Lower extremity 5/5  Tone and bulk:normal tone throughout; no atrophy noted  Sensory: Pinprick and light touch intact throughout, bilaterally  Deep Tendon Reflexes: 2+ and symmetric throughout  Plantars:  Right: downgoing    Left: downgoing  Cerebellar:  normal finger-to-nose and normal heel-to-shin test   Lab Results: Results for orders placed during the hospital encounter of 12/17/11 (from the past 48 hour(s))  PHENYTOIN LEVEL, TOTAL     Status:  Abnormal   Collection Time   12/17/11  3:45 AM      Component Value Range Comment   Phenytoin Lvl <2.5 (*) 10.0 - 20.0 (ug/mL)   BASIC METABOLIC PANEL     Status: Abnormal   Collection Time   12/17/11  3:45 AM      Component Value Range Comment   Sodium 144  135 - 145 (mEq/L)    Potassium 3.7  3.5 - 5.1 (mEq/L)    Chloride 98  96 - 112 (mEq/L)    CO2 <7 (*) 19 - 32 (mEq/L)    Glucose, Bld 179 (*) 70 - 99 (mg/dL)    BUN 10  6 - 23 (mg/dL)    Creatinine, Ser 0.86  0.50 - 1.35 (mg/dL)    Calcium 57.8 (*) 8.4 - 10.5 (mg/dL)    GFR calc non Af Amer 82 (*) >90 (mL/min)    GFR calc Af Amer >90  >90 (mL/min)   CBC     Status: Abnormal   Collection Time   12/17/11  3:45 AM      Component Value Range Comment   WBC 19.2 (*) 4.0 - 10.5 (K/uL) REPEATED TO VERIFY   RBC 5.83 (*) 4.22 - 5.81 (MIL/uL)    Hemoglobin 18.8 (*) 13.0 - 17.0 (g/dL) REPEATED TO VERIFY   HCT 51.2  39.0 - 52.0 (%)    MCV 87.8  78.0 - 100.0 (fL)    MCH 32.2  26.0 - 34.0 (pg)    MCHC 36.7 (*)  30.0 - 36.0 (g/dL)    RDW 16.1  09.6 - 04.5 (%)    Platelets 237  150 - 400 (K/uL)   DIFFERENTIAL     Status: Abnormal   Collection Time   12/17/11  3:45 AM      Component Value Range Comment   Neutrophils Relative 26 (*) 43 - 77 (%)    Lymphocytes Relative 61 (*) 12 - 46 (%)    Monocytes Relative 9  3 - 12 (%)    Eosinophils Relative 3  0 - 5 (%)    Basophils Relative 1  0 - 1 (%)    Neutro Abs 5.0  1.7 - 7.7 (K/uL)    Lymphs Abs 11.7 (*) 0.7 - 4.0 (K/uL)    Monocytes Absolute 1.7 (*) 0.1 - 1.0 (K/uL)    Eosinophils Absolute 0.6  0.0 - 0.7 (K/uL)    Basophils Absolute 0.2 (*) 0.0 - 0.1 (K/uL)    RBC Morphology TEARDROP CELLS      WBC Morphology ABSOLUTE LYMPHOCYTOSIS   ATYPICAL LYMPHOCYTES  ETHANOL     Status: Normal   Collection Time   12/17/11  3:45 AM      Component Value Range Comment   Alcohol, Ethyl (B) <11  0 - 11 (mg/dL)   URINE RAPID DRUG SCREEN (HOSP PERFORMED)     Status: Normal   Collection Time   12/17/11  4:04 AM        Component Value Range Comment   Opiates NONE DETECTED  NONE DETECTED     Cocaine NONE DETECTED  NONE DETECTED     Benzodiazepines NONE DETECTED  NONE DETECTED     Amphetamines NONE DETECTED  NONE DETECTED     Tetrahydrocannabinol NONE DETECTED  NONE DETECTED     Barbiturates NONE DETECTED  NONE DETECTED    GLUCOSE, CAPILLARY     Status: Abnormal   Collection Time   12/17/11  4:31 AM      Component Value Range Comment   Glucose-Capillary 173 (*) 70 - 99 (mg/dL)   BLOOD GAS, VENOUS     Status: Abnormal   Collection Time   12/17/11  5:00 AM      Component Value Range Comment   FIO2 1.00      Delivery systems NON-REBREATHER OXYGEN MASK      pH, Ven 7.226 (*) 7.250 - 7.300     pCO2, Ven 41.1 (*) 45.0 - 50.0 (mmHg)    pO2, Ven 58.7 (*) 30.0 - 45.0 (mmHg)    Bicarbonate 16.4 (*) 20.0 - 24.0 (mEq/L)    TCO2 14.5  0 - 100 (mmol/L)    Acid-base deficit 10.8 (*) 0.0 - 2.0 (mmol/L)    O2 Saturation 87.1      Patient temperature 98.6      Collection site VEIN      Drawn by COLLECTED BY LABORATORY      Sample type VENOUS     COMPREHENSIVE METABOLIC PANEL     Status: Abnormal   Collection Time   12/17/11  7:30 AM      Component Value Range Comment   Sodium 136  135 - 145 (mEq/L)    Potassium 3.2 (*) 3.5 - 5.1 (mEq/L)    Chloride 105  96 - 112 (mEq/L)    CO2 21  19 - 32 (mEq/L)    Glucose, Bld 97  70 - 99 (mg/dL)    BUN 9  6 - 23 (mg/dL)    Creatinine, Ser 4.09  0.50 - 1.35 (mg/dL)    Calcium 7.7 (*) 8.4 - 10.5 (mg/dL)    Total Protein 7.3  6.0 - 8.3 (g/dL)    Albumin 3.7  3.5 - 5.2 (g/dL)    AST 25  0 - 37 (U/L)    ALT 13  0 - 53 (U/L)    Alkaline Phosphatase 87  39 - 117 (U/L)    Total Bilirubin 0.4  0.3 - 1.2 (mg/dL)    GFR calc non Af Amer >90  >90 (mL/min)    GFR calc Af Amer >90  >90 (mL/min)   CBC     Status: Abnormal   Collection Time   12/17/11  7:30 AM      Component Value Range Comment   WBC 15.4 (*) 4.0 - 10.5 (K/uL)    RBC 5.20  4.22 - 5.81 (MIL/uL)    Hemoglobin  16.9  13.0 - 17.0 (g/dL)    HCT 16.1  09.6 - 04.5 (%)    MCV 83.5  78.0 - 100.0 (fL)    MCH 32.5  26.0 - 34.0 (pg)    MCHC 38.9 (*) 30.0 - 36.0 (g/dL)    RDW 40.9  81.1 - 91.4 (%)    Platelets 195  150 - 400 (K/uL)   DIFFERENTIAL     Status: Abnormal   Collection Time   12/17/11  7:30 AM      Component Value Range Comment   Neutrophils Relative 88 (*) 43 - 77 (%)    Neutro Abs 13.5 (*) 1.7 - 7.7 (K/uL)    Lymphocytes Relative 4 (*) 12 - 46 (%)    Lymphs Abs 0.6 (*) 0.7 - 4.0 (K/uL)    Monocytes Relative 8  3 - 12 (%)    Monocytes Absolute 1.3 (*) 0.1 - 1.0 (K/uL)    Eosinophils Relative 0  0 - 5 (%)    Eosinophils Absolute 0.0  0.0 - 0.7 (K/uL)    Basophils Relative 0  0 - 1 (%)    Basophils Absolute 0.0  0.0 - 0.1 (K/uL)   GLUCOSE, CAPILLARY     Status: Normal   Collection Time   12/17/11  7:51 AM      Component Value Range Comment   Glucose-Capillary 95  70 - 99 (mg/dL)    Comment 1 Notify RN      Comment 2 Documented in Chart     BLOOD GAS, ARTERIAL     Status: Abnormal   Collection Time   12/17/11  8:17 AM      Component Value Range Comment   FIO2 0.28      Delivery systems NASAL CANNULA      pH, Arterial 7.389  7.350 - 7.450     pCO2 arterial 34.9 (*) 35.0 - 45.0 (mmHg)    pO2, Arterial 152.0 (*) 80.0 - 100.0 (mmHg)    Bicarbonate 20.6  20.0 - 24.0 (mEq/L)    TCO2 17.5  0 - 100 (mmol/L)    Acid-base deficit 3.1 (*) 0.0 - 2.0 (mmol/L)    O2 Saturation 99.4      Patient temperature 98.6      Collection site RIGHT RADIAL      Drawn by [id number]      Sample type ARTERIAL DRAW      Allens test (pass/fail) PASS  PASS    MRSA PCR SCREENING     Status: Normal   Collection Time   12/17/11 11:49 AM      Component Value  Range Comment   MRSA by PCR NEGATIVE  NEGATIVE    GLUCOSE, CAPILLARY     Status: Abnormal   Collection Time   12/17/11  5:36 PM      Component Value Range Comment   Glucose-Capillary 69 (*) 70 - 99 (mg/dL)   GLUCOSE, CAPILLARY     Status: Abnormal   Collection  Time   12/17/11  7:44 PM      Component Value Range Comment   Glucose-Capillary 113 (*) 70 - 99 (mg/dL)    Comment 1 Documented in Chart      Comment 2 Notify RN     GLUCOSE, CAPILLARY     Status: Normal   Collection Time   12/17/11 11:39 PM      Component Value Range Comment   Glucose-Capillary 80  70 - 99 (mg/dL)   CARDIAC PANEL(CRET KIN+CKTOT+MB+TROPI)     Status: Abnormal   Collection Time   12/18/11 12:08 AM      Component Value Range Comment   Total CK 872 (*) 7 - 232 (U/L)    CK, MB 2.6  0.3 - 4.0 (ng/mL)    Troponin I <0.30  <0.30 (ng/mL)    Relative Index 0.3  0.0 - 2.5    PHENYTOIN LEVEL, TOTAL     Status: Abnormal   Collection Time   12/18/11  6:45 AM      Component Value Range Comment   Phenytoin Lvl 24.9 (*) 10.0 - 20.0 (ug/mL)   BASIC METABOLIC PANEL     Status: Abnormal   Collection Time   12/18/11  6:45 AM      Component Value Range Comment   Sodium 135  135 - 145 (mEq/L)    Potassium 3.9  3.5 - 5.1 (mEq/L)    Chloride 104  96 - 112 (mEq/L)    CO2 22  19 - 32 (mEq/L)    Glucose, Bld 67 (*) 70 - 99 (mg/dL)    BUN 5 (*) 6 - 23 (mg/dL)    Creatinine, Ser 4.09  0.50 - 1.35 (mg/dL)    Calcium 8.6  8.4 - 10.5 (mg/dL)    GFR calc non Af Amer >90  >90 (mL/min)    GFR calc Af Amer >90  >90 (mL/min)   CBC     Status: Abnormal   Collection Time   12/18/11  6:45 AM      Component Value Range Comment   WBC 9.6  4.0 - 10.5 (K/uL)    RBC 5.38  4.22 - 5.81 (MIL/uL)    Hemoglobin 17.7 (*) 13.0 - 17.0 (g/dL)    HCT 81.1  91.4 - 78.2 (%)    MCV 85.5  78.0 - 100.0 (fL)    MCH 32.9  26.0 - 34.0 (pg)    MCHC 38.5 (*) 30.0 - 36.0 (g/dL)    RDW 95.6  21.3 - 08.6 (%)    Platelets 194  150 - 400 (K/uL)   CARDIAC PANEL(CRET KIN+CKTOT+MB+TROPI)     Status: Abnormal   Collection Time   12/18/11  6:45 AM      Component Value Range Comment   Total CK 1118 (*) 7 - 232 (U/L)    CK, MB 3.0  0.3 - 4.0 (ng/mL)    Troponin I <0.30  <0.30 (ng/mL)    Relative Index 0.3  0.0 - 2.5    GLUCOSE,  CAPILLARY     Status: Normal   Collection Time   12/18/11 12:06 PM  Component Value Range Comment   Glucose-Capillary 87  70 - 99 (mg/dL)    Comment 1 Documented in Chart      Comment 2 Notify RN       Recent Results (from the past 240 hour(s))  MRSA PCR SCREENING     Status: Normal   Collection Time   12/17/11 11:49 AM      Component Value Range Status Comment   MRSA by PCR NEGATIVE  NEGATIVE  Final     Lipid Panel No results found for this basename: CHOL,TRIG,HDL,CHOLHDL,VLDL,LDLCALC in the last 72 hours  Studies/Results: Ct Head Wo Contrast  12/17/2011  *RADIOLOGY REPORT*  Clinical Data: Seizures.  CT HEAD WITHOUT CONTRAST  Technique:  Contiguous axial images were obtained from the base of the skull through the vertex without contrast.  Comparison: MRI brain 07/31/2011.  Findings: No acute infarct, hemorrhage, mass lesion is present. The ventricles are of normal size.  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  No significant extra-axial fluid collection is present.  A nasal airway is in place.  The maxillary sinuses are small bilaterally.  The paranasal sinuses and mastoid air cells are clear.  The osseous skull is intact.  IMPRESSION:  1.  Normal CT appearance of the brain. 2.  Small bilateral maxillary sinuses, compatible with a history of chronic disease.  Original Report Authenticated By: Jamesetta Orleans. MATTERN, M.D.   Dg Chest Port 1 View  12/17/2011  *RADIOLOGY REPORT*  Clinical Data: Seizure.  Rule out aspiration.  PORTABLE CHEST - 1 VIEW  Comparison: 07/30/2011.  Findings: Heart size is normal.  Mild bronchitic changes are evident.  No focal airspace disease evident.  The visualized soft tissues and bony thorax are unremarkable.  IMPRESSION:  1.  Mild perihilar bronchitic changes. Acute bronchitis is not excluded. 2.  No evidence for airspace consolidation or aspiration.  The  Original Report Authenticated By: Jamesetta Orleans. MATTERN, M.D.    Medications:   I have reviewed the patient's current medications. Scheduled:   . metoprolol tartrate  25 mg Oral BID  . phenytoin  100 mg Oral TID  . potassium chloride  10 mEq Intravenous Q1 Hr x 3  . sodium chloride  3 mL Intravenous Q12H  . DISCONTD: metoprolol tartrate  12.5 mg Oral BID  . DISCONTD: phenytoin (DILANTIN) IV  100 mg Intravenous Q8H    Assessment/Plan:  Patient Active Hospital Problem List: Status epilepticus (07/30/2011)   Assessment: No further seizure activity noted.  Patient with a Dilantin level of 24.9.  Has been changed to po.  Next dose being held.   Plan:  1.  Will follow up level in AM     LOS: 1 day   Thana Farr, MD [facility] [phone] 12/18/2011  12:28 PM

## 2011-12-19 DIAGNOSIS — M6282 Rhabdomyolysis: Secondary | ICD-10-CM

## 2011-12-19 DIAGNOSIS — E872 Acidosis: Secondary | ICD-10-CM

## 2011-12-19 DIAGNOSIS — G40401 Other generalized epilepsy and epileptic syndromes, not intractable, with status epilepticus: Secondary | ICD-10-CM

## 2011-12-19 DIAGNOSIS — D7289 Other specified disorders of white blood cells: Secondary | ICD-10-CM

## 2011-12-19 LAB — CBC
HCT: 40.1 % (ref 39.0–52.0)
Hemoglobin: 14.8 g/dL (ref 13.0–17.0)
MCH: 31.4 pg (ref 26.0–34.0)
MCHC: 36.9 g/dL — ABNORMAL HIGH (ref 30.0–36.0)
MCV: 85 fL (ref 78.0–100.0)
RDW: 12.4 % (ref 11.5–15.5)

## 2011-12-19 LAB — GLUCOSE, CAPILLARY
Glucose-Capillary: 73 mg/dL (ref 70–99)
Glucose-Capillary: 69 mg/dL — ABNORMAL LOW (ref 70–99)

## 2011-12-19 LAB — BASIC METABOLIC PANEL
BUN: 11 mg/dL (ref 6–23)
Chloride: 104 mEq/L (ref 96–112)
Creatinine, Ser: 1.06 mg/dL (ref 0.50–1.35)
GFR calc Af Amer: >90 mL/min (ref >90)
Glucose, Bld: 65 mg/dL — ABNORMAL LOW (ref 70–99)
Potassium: 3.7 mEq/L (ref 3.5–5.1)

## 2011-12-19 LAB — PATHOLOGIST SMEAR REVIEW

## 2011-12-19 MED ORDER — PHENYTOIN SODIUM EXTENDED 100 MG PO CAPS
100.0000 mg | ORAL_CAPSULE | Freq: Two times a day (BID) | ORAL | Status: DC
  Administered 2011-12-20: 100 mg via ORAL
  Filled 2011-12-19 (×2): qty 1

## 2011-12-19 NOTE — Progress Notes (Signed)
  Pharmacy Note (Brief)  Discussed coordination of phenytoin dosing/monitoring with Dr. Thad Ranger of Neurology; they will be taking over all dosing changes and level monitoring. Pharmacy will sign off and defer to Neuro.  Thanks.  Darrol Angel, PharmD Pager: [phone] 12/19/2011 1:35 PM

## 2011-12-19 NOTE — Progress Notes (Signed)
Subjective: Patient more alert and awake.  Dilantin level remains supratherapeutic and actually is rising.    Objective: Current vital signs: BP 148/91  Pulse 76  Temp(Src) 97.8 F (36.6 C) (Oral)  Resp 18  Ht 5\' 2"  (1.575 m)  Wt 52 kg (114 lb 10.2 oz)  BMI 20.97 kg/m2  SpO2 100% Vital signs in last 24 hours: Temp:  [97.4 F (36.3 C)-99.9 F (37.7 C)] 97.8 F (36.6 C) (06/10 1415) Pulse Rate:  [69-95] 76  (06/10 1415) Resp:  [17-20] 18  (06/10 1415) BP: (106-151)/(58-97) 148/91 mmHg (06/10 1415) SpO2:  [97 %-100 %] 100 % (06/10 1415) Weight:  [52 kg (114 lb 10.2 oz)] 52 kg (114 lb 10.2 oz) (06/10 0400)  Intake/Output from previous day: 06/09 0701 - 06/10 0700 In: 3250 [P.O.:240; I.V.:3000; IV Piggyback:10] Out: 2785 [Urine:2785] Intake/Output this shift: Total I/O In: 1162.5 [I.V.:1162.5] Out: 900 [Urine:900] Nutritional status: General  Neurologic Exam: Alert. Difficulty following 3-step commands  Cranial Nerves:  II: visual fields grossly normal, pupils equal, round, reactive to light and accommodation  III,IV, VI: ptosis not present, extra-ocular motions intact bilaterally  V,VII: decreased left NLF, facial light touch sensation normal bilaterally  VIII: hearing normal bilaterally  IX,X: gag reflex present  XI: trapezius strength/neck flexion strength normal bilaterally  XII: tongue strength normal  Motor:  Right : Upper extremity 5/5 Left: Upper extremity 5/5  Lower extremity 5/5 Lower extremity 5/5  Tone and bulk:normal tone throughout; no atrophy noted  Sensory: Pinprick and light touch intact throughout, bilaterally  Deep Tendon Reflexes: 2+ and symmetric throughout  Plantars:  Right: downgoing Left: downgoing  Cerebellar:  normal finger-to-nose and normal heel-to-shin test    Lab Results: Results for orders placed during the hospital encounter of 12/17/11 (from the past 48 hour(s))  GLUCOSE, CAPILLARY     Status: Abnormal   Collection Time   12/17/11   5:36 PM      Component Value Range Comment   Glucose-Capillary 69 (*) 70 - 99 (mg/dL)   GLUCOSE, CAPILLARY     Status: Abnormal   Collection Time   12/17/11  7:44 PM      Component Value Range Comment   Glucose-Capillary 113 (*) 70 - 99 (mg/dL)    Comment 1 Documented in Chart      Comment 2 Notify RN     GLUCOSE, CAPILLARY     Status: Normal   Collection Time   12/17/11 11:39 PM      Component Value Range Comment   Glucose-Capillary 80  70 - 99 (mg/dL)   CARDIAC PANEL(CRET KIN+CKTOT+MB+TROPI)     Status: Abnormal   Collection Time   12/18/11 12:08 AM      Component Value Range Comment   Total CK 872 (*) 7 - 232 (U/L)    CK, MB 2.6  0.3 - 4.0 (ng/mL)    Troponin I <0.30  <0.30 (ng/mL)    Relative Index 0.3  0.0 - 2.5    PHENYTOIN LEVEL, TOTAL     Status: Abnormal   Collection Time   12/18/11  6:45 AM      Component Value Range Comment   Phenytoin Lvl 24.9 (*) 10.0 - 20.0 (ug/mL)   BASIC METABOLIC PANEL     Status: Abnormal   Collection Time   12/18/11  6:45 AM      Component Value Range Comment   Sodium 135  135 - 145 (mEq/L)    Potassium 3.9  3.5 - 5.1 (mEq/L)  Chloride 104  96 - 112 (mEq/L)    CO2 22  19 - 32 (mEq/L)    Glucose, Bld 67 (*) 70 - 99 (mg/dL)    BUN 5 (*) 6 - 23 (mg/dL)    Creatinine, Ser 4.54  0.50 - 1.35 (mg/dL)    Calcium 8.6  8.4 - 10.5 (mg/dL)    GFR calc non Af Amer >90  >90 (mL/min)    GFR calc Af Amer >90  >90 (mL/min)   CBC     Status: Abnormal   Collection Time   12/18/11  6:45 AM      Component Value Range Comment   WBC 9.6  4.0 - 10.5 (K/uL)    RBC 5.38  4.22 - 5.81 (MIL/uL)    Hemoglobin 17.7 (*) 13.0 - 17.0 (g/dL)    HCT 09.8  11.9 - 14.7 (%)    MCV 85.5  78.0 - 100.0 (fL)    MCH 32.9  26.0 - 34.0 (pg)    MCHC 38.5 (*) 30.0 - 36.0 (g/dL)    RDW 82.9  56.2 - 13.0 (%)    Platelets 194  150 - 400 (K/uL)   CARDIAC PANEL(CRET KIN+CKTOT+MB+TROPI)     Status: Abnormal   Collection Time   12/18/11  6:45 AM      Component Value Range Comment   Total  CK 1118 (*) 7 - 232 (U/L)    CK, MB 3.0  0.3 - 4.0 (ng/mL)    Troponin I <0.30  <0.30 (ng/mL)    Relative Index 0.3  0.0 - 2.5    GLUCOSE, CAPILLARY     Status: Normal   Collection Time   12/18/11 12:06 PM      Component Value Range Comment   Glucose-Capillary 87  70 - 99 (mg/dL)    Comment 1 Documented in Chart      Comment 2 Notify RN     CARDIAC PANEL(CRET KIN+CKTOT+MB+TROPI)     Status: Abnormal   Collection Time   12/18/11  4:10 PM      Component Value Range Comment   Total CK 2131 (*) 7 - 232 (U/L)    CK, MB 3.5  0.3 - 4.0 (ng/mL)    Troponin I <0.30  <0.30 (ng/mL)    Relative Index 0.2  0.0 - 2.5    GLUCOSE, CAPILLARY     Status: Normal   Collection Time   12/18/11 11:35 PM      Component Value Range Comment   Glucose-Capillary 91  70 - 99 (mg/dL)    Comment 1 Documented in Chart      Comment 2 Notify RN     BASIC METABOLIC PANEL     Status: Abnormal   Collection Time   12/19/11  3:30 AM      Component Value Range Comment   Sodium 138  135 - 145 (mEq/L)    Potassium 3.7  3.5 - 5.1 (mEq/L)    Chloride 104  96 - 112 (mEq/L)    CO2 25  19 - 32 (mEq/L)    Glucose, Bld 65 (*) 70 - 99 (mg/dL)    BUN 11  6 - 23 (mg/dL)    Creatinine, Ser 8.65  0.50 - 1.35 (mg/dL)    Calcium 8.5  8.4 - 10.5 (mg/dL)    GFR calc non Af Amer >90  >90 (mL/min)    GFR calc Af Amer >90  >90 (mL/min)   CBC     Status: Abnormal   Collection  Time   12/19/11  3:30 AM      Component Value Range Comment   WBC 9.6  4.0 - 10.5 (K/uL)    RBC 4.72  4.22 - 5.81 (MIL/uL)    Hemoglobin 14.8  13.0 - 17.0 (g/dL)    HCT 09.8  11.9 - 14.7 (%)    MCV 85.0  78.0 - 100.0 (fL)    MCH 31.4  26.0 - 34.0 (pg)    MCHC 36.9 (*) 30.0 - 36.0 (g/dL)    RDW 82.9  56.2 - 13.0 (%)    Platelets 205  150 - 400 (K/uL)   PHENYTOIN LEVEL, TOTAL     Status: Abnormal   Collection Time   12/19/11  3:30 AM      Component Value Range Comment   Phenytoin Lvl 25.6 (*) 10.0 - 20.0 (ug/mL)   CK     Status: Abnormal   Collection Time    12/19/11  3:30 AM      Component Value Range Comment   Total CK 4808 (*) 7 - 232 (U/L)   GLUCOSE, CAPILLARY     Status: Normal   Collection Time   12/19/11  3:37 AM      Component Value Range Comment   Glucose-Capillary 73  70 - 99 (mg/dL)    Comment 1 Documented in Chart      Comment 2 Notify RN     GLUCOSE, CAPILLARY     Status: Abnormal   Collection Time   12/19/11  7:30 AM      Component Value Range Comment   Glucose-Capillary 69 (*) 70 - 99 (mg/dL)    Comment 1 Documented in Chart      Comment 2 Notify RN     GLUCOSE, CAPILLARY     Status: Normal   Collection Time   12/19/11 12:26 PM      Component Value Range Comment   Glucose-Capillary 74  70 - 99 (mg/dL)    Comment 1 Documented in Chart      Comment 2 Notify RN       Recent Results (from the past 240 hour(s))  MRSA PCR SCREENING     Status: Normal   Collection Time   12/17/11 11:49 AM      Component Value Range Status Comment   MRSA by PCR NEGATIVE  NEGATIVE  Final     Lipid Panel No results found for this basename: CHOL,TRIG,HDL,CHOLHDL,VLDL,LDLCALC in the last 72 hours  Studies/Results: No results found.  Medications:  I have reviewed the patient's current medications. Scheduled:   . metoprolol tartrate  25 mg Oral BID  . DISCONTD: phenytoin  100 mg Oral Q8H  . DISCONTD: sodium chloride  3 mL Intravenous Q12H    Assessment/Plan:  Patient Active Hospital Problem List: Status epilepticus (07/30/2011)   Assessment: Patient remains stable.  Dilantin level toxic despite holding a dose on yesterday.   Plan:  1.  Hold Dilantin today and restart tomorrow at 200mg  a day.  Case discussed with Dr. Isidoro Donning and pharmacy     LOS: 2 days   Thana Farr, MD [facility] [phone] 12/19/2011  5:35 PM

## 2011-12-19 NOTE — Progress Notes (Signed)
Notified MD about getting pt a social work/case management consult for managing medication compliance at home. MD aware and going to place orders.  Langley Gauss, RN

## 2011-12-19 NOTE — Progress Notes (Signed)
Patient ID: Douglas Rivas  male  NWG:[id number]    DOB: Aug 07, 1985    DOA: 12/17/2011  PCP: No primary provider on file.  Subjective: Patient seen and examined the earlier this morning, alert and awake, not repeat seizures  Objective: Weight change: -2.432 kg (-5 lb 5.8 oz)  Intake/Output Summary (Last 24 hours) at 12/19/11 1426 Last data filed at 12/19/11 1300  Gross per 24 hour  Intake 3227.5 ml  Output   2635 ml  Net  592.5 ml   Blood pressure 148/91, pulse 76, temperature 97.8 F (36.6 C), temperature source Oral, resp. rate 18, height 5\' 2"  (1.575 m), weight 52 kg (114 lb 10.2 oz), SpO2 100.00%.  Physical Exam: General: Alert and oriented x3  HEENT: anicteric sclera, pupils reactive to light and accommodation, EOMI CVS: S1-S2 clear, no murmur rubs or gallops Chest: clear to auscultation bilaterally, no wheezing, rales or rhonchi Abdomen: soft nontender, nondistended, normal bowel sounds, no organomegaly Extremities: no cyanosis, clubbing or edema noted bilaterally   Lab Results: Basic Metabolic Panel:  Lab 12/19/11 5784 12/18/11 0645  NA 138 135  K 3.7 3.9  CL 104 104  CO2 25 22  GLUCOSE 65* 67*  BUN 11 5*  CREATININE 1.06 0.92  CALCIUM 8.5 8.6  MG -- --  PHOS -- --   Liver Function Tests:  Lab 12/17/11 0730  AST 25  ALT 13  ALKPHOS 87  BILITOT 0.4  PROT 7.3  ALBUMIN 3.7   CBC:  Lab 12/19/11 0330 12/18/11 0645 12/17/11 0730  WBC 9.6 9.6 --  NEUTROABS -- -- 13.5*  HGB 14.8 17.7* --  HCT 40.1 46.0 --  MCV 85.0 85.5 --  PLT 205 194 --   Cardiac Enzymes:  Lab 12/19/11 0330 12/18/11 1610 12/18/11 0645 12/18/11 0008  CKTOTAL 4808* 2131* 1118* --  CKMB -- 3.5 3.0 2.6  CKMBINDEX -- -- -- --  TROPONINI -- <0.30 <0.30 <0.30   BNP: No components found with this basename: POCBNP:2 CBG:  Lab 12/19/11 1226 12/19/11 0730 12/19/11 0337 12/18/11 2335 12/18/11 1206  GLUCAP 74 69* 73 91 87     Micro Results: Recent Results (from the past 240  hour(s))  MRSA PCR SCREENING     Status: Normal   Collection Time   12/17/11 11:49 AM      Component Value Range Status Comment   MRSA by PCR NEGATIVE  NEGATIVE  Final     Studies/Results: Ct Head Wo Contrast  12/17/2011  *RADIOLOGY REPORT*  Clinical Data: Seizures.  CT HEAD WITHOUT CONTRAST  Technique:  Contiguous axial images were obtained from the base of the skull through the vertex without contrast.  Comparison: MRI brain 07/31/2011.  Findings: No acute infarct, hemorrhage, mass lesion is present. The ventricles are of normal size.  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  No significant extra-axial fluid collection is present.  A nasal airway is in place.  The maxillary sinuses are small bilaterally.  The paranasal sinuses and mastoid air cells are clear.  The osseous skull is intact.  IMPRESSION:  1.  Normal CT appearance of the brain. 2.  Small bilateral maxillary sinuses, compatible with a history of chronic disease.  Original Report Authenticated By: Jamesetta Orleans. MATTERN, M.D.   Dg Chest Port 1 View  12/17/2011  *RADIOLOGY REPORT*  Clinical Data: Seizure.  Rule out aspiration.  PORTABLE CHEST - 1 VIEW  Comparison: 07/30/2011.  Findings: Heart size is normal.  Mild bronchitic changes are evident.  No focal airspace disease evident.  The visualized soft tissues and bony thorax are unremarkable.  IMPRESSION:  1.  Mild perihilar bronchitic changes. Acute bronchitis is not excluded. 2.  No evidence for airspace consolidation or aspiration.  The  Original Report Authenticated By: Jamesetta Orleans. MATTERN, M.D.    Medications: Scheduled Meds:    . metoprolol tartrate  25 mg Oral BID  . sodium chloride  3 mL Intravenous Q12H  . DISCONTD: phenytoin  100 mg Oral Q8H   Continuous Infusions:    . sodium chloride 150 mL/hr at 12/19/11 0830     Assessment/Plan: Principal Problem:   *Status epilepticus - No repeat seizures after admission. S/p IV keppra loading  dose - Dilantin level increasing 25.6 (<-24.9), discussed with Dr. Thad Ranger who recommended holding Dilantin today and starting tomorrow at 200 mg daily    - Patient will need PCP and case management resources for prescriptions, placed consult   Rhabdomyolysis: CK trending up, Likely secondary to status epilepticus - Continue aggressive fluid hydration, increase to 150 cc an hour, no acute renal insufficiency  EKG changes/LVH/HTN: - cardiac enzymes neg, no chest pain, continue BB - 2-D echocardiogram done, EF 50-55% normal wall motion  Acute metabolic acidosis: Likely secondary to status epilepticus, resolved - Continue IV fluid hydration    Leukocytosis: resolved chest x-ray negative for any acute aspiration pneumonitis or pneumonia  Hypokalemia: resolved   DVT Prophylaxis: SCDs   Code Status:  Disposition: Transfer to floor, hopefully DC tomorrow   LOS: 2 days   Carli Lefevers M.D. [facility] 12/19/2011, 2:26 PM Pager: [phone]

## 2011-12-19 NOTE — Clinical Social Work Note (Signed)
Pt discussed on rounds to have issues affording medications. CSW contacted RN CM, Colleen Can to follow up. Pt appears to be self pay.  Vennie Homans, [place] 12/19/2011 11:24 AM [phone]

## 2011-12-19 NOTE — Progress Notes (Signed)
Utilization review completed.  

## 2011-12-20 DIAGNOSIS — M6282 Rhabdomyolysis: Secondary | ICD-10-CM

## 2011-12-20 DIAGNOSIS — G40401 Other generalized epilepsy and epileptic syndromes, not intractable, with status epilepticus: Secondary | ICD-10-CM

## 2011-12-20 DIAGNOSIS — E872 Acidosis: Secondary | ICD-10-CM

## 2011-12-20 DIAGNOSIS — D7289 Other specified disorders of white blood cells: Secondary | ICD-10-CM

## 2011-12-20 LAB — CBC
HCT: 42.3 % (ref 39.0–52.0)
Hemoglobin: 15.9 g/dL (ref 13.0–17.0)
MCHC: 37.6 g/dL — ABNORMAL HIGH (ref 30.0–36.0)
MCV: 84.8 fL (ref 78.0–100.0)

## 2011-12-20 LAB — BASIC METABOLIC PANEL
BUN: 10 mg/dL (ref 6–23)
CO2: 24 mEq/L (ref 19–32)
Chloride: 105 mEq/L (ref 96–112)
Creatinine, Ser: 0.92 mg/dL (ref 0.50–1.35)
GFR calc Af Amer: >90 mL/min (ref >90)
Glucose, Bld: 80 mg/dL (ref 70–99)
Potassium: 3.7 mEq/L (ref 3.5–5.1)

## 2011-12-20 LAB — CK: Total CK: 4792 U/L — ABNORMAL HIGH (ref 7–232)

## 2011-12-20 MED ORDER — METOPROLOL TARTRATE 25 MG PO TABS: 25.0000 mg | ORAL_TABLET | Freq: Two times a day (BID) | ORAL | Status: DC

## 2011-12-20 MED ORDER — METOPROLOL TARTRATE 25 MG PO TABS: 25.0000 mg | ORAL_TABLET | Freq: Two times a day (BID) | ORAL | Status: AC

## 2011-12-20 MED ORDER — PHENYTOIN SODIUM EXTENDED 100 MG PO CAPS: 100.0000 mg | ORAL_CAPSULE | Freq: Two times a day (BID) | ORAL | Status: AC

## 2011-12-20 MED ORDER — PHENYTOIN SODIUM EXTENDED 100 MG PO CAPS: 100.0000 mg | ORAL_CAPSULE | Freq: Two times a day (BID) | ORAL | Status: DC

## 2011-12-20 NOTE — Progress Notes (Signed)
CARE MANAGEMENT NOTE 12/20/2011  Patient:  Lua,Gohan   Account Number:  [id number]  Date Initiated:  12/20/2011  Documentation initiated by:  Kaelie Henigan  Subjective/Objective Assessment:   26 yo male admitted 12/17/11 with seizures     Action/Plan:   D/C when medically stable   Anticipated DC Date:  12/20/2011   Anticipated DC Plan:  HOME/SELF CARE      DC Planning Services  CM consult  Follow-up appt scheduled  Medication Assistance      Choice offered to / List presented to:  C-1 Patient           Status of service:  Completed, signed off  Discharge Disposition:  HOME/SELF CARE  Per UR Regulation:  Reviewed for med. necessity/level of care/duration of stay  Comments:  12/20/11, Kathi Der RNC-MNN, BSN, [phone]. CM received referral from Dr. Isidoro Donning for medication assistance and PCP appt. scheduling for pt/  Pt had appt. set up with [facility] in the past and did not go.  They will not make an appt. for pt as they are not  taking new patients at this time. Appointment scheduled [facility] pt. at [facility] on 12/27/11 at 2:30pm.  Information regarding appt. given to pt., and also placed in discharge summary for pt.  Pt does qualify for indigent funds.  Loressor and Dilantin requested for pt.  Prescriptions sent to pharmacy.

## 2011-12-20 NOTE — Discharge Summary (Signed)
Physician Discharge Summary  Patient ID: Douglas Rivas MRN: [id number] DOB/AGE: Dec 17, 1985 26 y.o.  Admit date: 12/17/2011 Discharge date: 12/20/2011  Primary Care Physician:  No primary provider on file.  Discharge Diagnoses:    .Status epilepticus Altered mental status secondary to postictal state resolved Hypertension with tachycardia improved Rhabdomyolysis improving Acute metabolic acidosis secondary to seizures resolved Medical noncompliance  Consults:  Neurology, Dr. Ferne Coe  Discharge Medications: Medication List  As of 12/20/2011  3:50 PM   TAKE these medications         metoprolol tartrate 25 MG tablet   Commonly known as: LOPRESSOR   Take 1 tablet (25 mg total) by mouth 2 (two) times daily.      phenytoin 100 MG ER capsule   Commonly known as: DILANTIN   Take 1 capsule (100 mg total) by mouth 2 (two) times daily.             Brief H and P: For complete details please refer to admission H and P, but in brief 26 year old male with known history of seizures who was last admitted here in January 2013 with status epilepticus was brought into the ER because of seizures. As per patient's family who was by his side patient had seizures continuously for at least one hour before comming. While in the ER patient had another seizure for one half minute and had received at least 4 mg of IV Ativan and 500 mg of IV Keppra. After which his seizures stopped. In the ED, patient was mostly postictal and not able to provide any history. Patient was admitted for seizures  with status epilepticus most likely from noncompliance with medications. His last prescription refill on the bottle was by Dr.Abrol who had discharged him last January 2013   Hospital Course:   Status epilepticus : Patient was admitted to the step down unit. He was placed on IV Keppra loading dose and Dilantin IV. At the time of admission his Dilantin level was less than 2.5 which is consistent with his medical  noncompliance. He was started on IV Dilantin. Patient had no repeat seizures after admission. Neurology was consulted and patient was placed on IV Dilantin 100 mg TID. Subsequently Dilantin level started increasing hands patient was monitored closely and Dilantin was held. This morning the Dilantin level has started to trend down at 22.1 inpatient was discharged on Dilantin 100 mgBID.  Rhabdomyolysis: Likely secondary to status epilepticus, patient was placed on aggressive IV fluid hydration   EKG changes/LVH/HTN: Cardiac enzymes remain negative, patient was asymptomatic, patient was placed on metoprolol.2-D echocardiogram was done which showed EF 50-55% normal wall motion   Acute metabolic acidosis: Likely secondary to status epilepticus,  also resolved With IV fluid hydration  Leukocytosis: resolved,  chest x-ray  was negative for any acute aspiration pneumonitis or pneumonia   Hypokalemia: resolved    Day of Discharge BP 158/98  Pulse 67  Temp(Src) 97.5 F (36.4 C) (Oral)  Resp 16  Ht 5\' 2"  (1.575 m)  Wt 52 kg (114 lb 10.2 oz)  BMI 20.97 kg/m2  SpO2 95%  Physical Exam: General: Alert and awake oriented x3 not in any acute distress. HEENT: anicteric sclera, pupils reactive to light and accommodation CVS: S1-S2 clear no murmur rubs or gallops Chest: clear to auscultation bilaterally, no wheezing rales or rhonchi Abdomen: soft nontender, nondistended, normal bowel sounds, no organomegaly Extremities: no cyanosis, clubbing or edema noted bilaterally Neuro: Cranial nerves II-XII intact, no focal neurological deficits   The results  of significant diagnostics from this hospitalization (including imaging, microbiology, ancillary and laboratory) are listed below for reference.    LAB RESULTS: Basic Metabolic Panel:  Lab 12/20/11 0981 12/19/11 0330  NA 139 138  K 3.7 3.7  CL 105 104  CO2 24 25  GLUCOSE 80 65*  BUN 10 11  CREATININE 0.92 1.06  CALCIUM 8.4 8.5  MG -- --  PHOS  -- --   Liver Function Tests:  Lab 12/17/11 0730  AST 25  ALT 13  ALKPHOS 87  BILITOT 0.4  PROT 7.3  ALBUMIN 3.7   CBC:  Lab 12/20/11 0422 12/19/11 0330 12/17/11 0730  WBC 8.6 9.6 --  NEUTROABS -- -- 13.5*  HGB 15.9 14.8 --  HCT 42.3 40.1 --  MCV 84.8 -- --  PLT 226 205 --   Cardiac Enzymes:  Lab 12/20/11 0422 12/19/11 0330 12/18/11 1610 12/18/11 0645  CKTOTAL 4792* 4808* -- --  CKMB -- -- 3.5 3.0  CKMBINDEX -- -- -- --  TROPONINI -- -- <0.30 <0.30   BNP: No components found with this basename: POCBNP:2 CBG:  Lab 12/19/11 1226 12/19/11 0730  GLUCAP 74 69*    Significant Diagnostic Studies:  Ct Head Wo Contrast  12/17/2011  *RADIOLOGY REPORT*  Clinical Data: Seizures.  CT HEAD WITHOUT CONTRAST  Technique:  Contiguous axial images were obtained from the base of the skull through the vertex without contrast.  Comparison: MRI brain 07/31/2011.  Findings: No acute infarct, hemorrhage, mass lesion is present. The ventricles are of normal size.  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  No significant extra-axial fluid collection is present.  A nasal airway is in place.  The maxillary sinuses are small bilaterally.  The paranasal sinuses and mastoid air cells are clear.  The osseous skull is intact.  IMPRESSION:  1.  Normal CT appearance of the brain. 2.  Small bilateral maxillary sinuses, compatible with a history of chronic disease.  Original Report Authenticated By: Jamesetta Orleans. MATTERN, M.D.   Dg Chest Port 1 View  12/17/2011  *RADIOLOGY REPORT*  Clinical Data: Seizure.  Rule out aspiration.  PORTABLE CHEST - 1 VIEW  Comparison: 07/30/2011.  Findings: Heart size is normal.  Mild bronchitic changes are evident.  No focal airspace disease evident.  The visualized soft tissues and bony thorax are unremarkable.  IMPRESSION:  1.  Mild perihilar bronchitic changes. Acute bronchitis is not excluded. 2.  No evidence for airspace consolidation or aspiration.   The  Original Report Authenticated By: Jamesetta Orleans. MATTERN, M.D.     Disposition and Follow-up: Discharge Orders    Future Orders Please Complete By Expires   Diet - low sodium heart healthy      Discharge instructions      Comments:   No driving   Increase activity slowly          DISPOSITION: Home  DIET: Heart healthy  ACTIVITY:As tolerated   DISCHARGE FOLLOW-UP Follow-up Information    Follow up with [facility] on 12/27/2011. (at 2:30pm)    Contact information:     [facility]-[phone].  $ 50.00 will be required at first visit.  If unable to pay, call Ms. Henry at [phone] before appointment to make arrangements         Time spent on Discharge:  45 minutes   Signed:  Eulises Kijowski M.D. [facility] 12/20/2011, 3:50 PM

## 2011-12-20 NOTE — Progress Notes (Signed)
Ambulated hallway, steady gait, no dizziness, no complaints, tolerated well.

## 2011-12-20 NOTE — Progress Notes (Signed)
Subjective: No further seizure activity noted.  Patient ambulating in the hallway independently.  No complaints.  Tolerating Di2lantin.  Level today of 22.1  Objective: Current vital signs: BP 158/98  Pulse 67  Temp(Src) 97.5 F (36.4 C) (Oral)  Resp 16  Ht 5\' 2"  (1.575 m)  Wt 52 kg (114 lb 10.2 oz)  BMI 20.97 kg/m2  SpO2 95% Vital signs in last 24 hours: Temp:  [97.5 F (36.4 C)-97.6 F (36.4 C)] 97.5 F (36.4 C) (06/11 0646) Pulse Rate:  [67-86] 67  (06/11 0646) Resp:  [16-18] 16  (06/11 0646) BP: (142-177)/(98-110) 158/98 mmHg (06/11 0646) SpO2:  [95 %-98 %] 95 % (06/11 0646)  Intake/Output from previous day: 06/10 0701 - 06/11 0700 In: 3652.5 [P.O.:240; I.V.:3412.5] Out: 900 [Urine:900] Intake/Output this shift: Total I/O In: 240 [P.O.:240] Out: -  Nutritional status: General  Neurologic Exam: Alert. Follows 3-step commands  Cranial Nerves:  II: visual fields grossly normal, pupils equal, round, reactive to light and accommodation  III,IV, VI: ptosis not present, extra-ocular motions intact bilaterally  V,VII: decreased left NLF, facial light touch sensation normal bilaterally  VIII: hearing normal bilaterally  IX,X: gag reflex present  XI: trapezius strength/neck flexion strength normal bilaterally  XII: tongue strength normal  Motor:  Right : Upper extremity 5/5    Left: Upper extremity 5/5   Lower extremity 5/5                       Lower extremity 5/5  Tone and bulk:normal tone throughout; no atrophy noted  Sensory: Pinprick and light touch intact throughout, bilaterally  Deep Tendon Reflexes: 2+ and symmetric throughout  Plantars:  Right: downgoing    Left: downgoing  Cerebellar:  normal finger-to-nose and normal heel-to-shin test.  Gait normal.   Lab Results: Results for orders placed during the hospital encounter of 12/17/11 (from the past 48 hour(s))  CARDIAC PANEL(CRET KIN+CKTOT+MB+TROPI)     Status: Abnormal   Collection Time   12/18/11  4:10 PM       Component Value Range Comment   Total CK 2131 (*) 7 - 232 (U/L)    CK, MB 3.5  0.3 - 4.0 (ng/mL)    Troponin I <0.30  <0.30 (ng/mL)    Relative Index 0.2  0.0 - 2.5    GLUCOSE, CAPILLARY     Status: Normal   Collection Time   12/18/11 11:35 PM      Component Value Range Comment   Glucose-Capillary 91  70 - 99 (mg/dL)    Comment 1 Documented in Chart      Comment 2 Notify RN     BASIC METABOLIC PANEL     Status: Abnormal   Collection Time   12/19/11  3:30 AM      Component Value Range Comment   Sodium 138  135 - 145 (mEq/L)    Potassium 3.7  3.5 - 5.1 (mEq/L)    Chloride 104  96 - 112 (mEq/L)    CO2 25  19 - 32 (mEq/L)    Glucose, Bld 65 (*) 70 - 99 (mg/dL)    BUN 11  6 - 23 (mg/dL)    Creatinine, Ser 1.61  0.50 - 1.35 (mg/dL)    Calcium 8.5  8.4 - 10.5 (mg/dL)    GFR calc non Af Amer >90  >90 (mL/min)    GFR calc Af Amer >90  >90 (mL/min)   CBC     Status: Abnormal   Collection  Time   12/19/11  3:30 AM      Component Value Range Comment   WBC 9.6  4.0 - 10.5 (K/uL)    RBC 4.72  4.22 - 5.81 (MIL/uL)    Hemoglobin 14.8  13.0 - 17.0 (g/dL)    HCT 16.1  09.6 - 04.5 (%)    MCV 85.0  78.0 - 100.0 (fL)    MCH 31.4  26.0 - 34.0 (pg)    MCHC 36.9 (*) 30.0 - 36.0 (g/dL)    RDW 40.9  81.1 - 91.4 (%)    Platelets 205  150 - 400 (K/uL)   PHENYTOIN LEVEL, TOTAL     Status: Abnormal   Collection Time   12/19/11  3:30 AM      Component Value Range Comment   Phenytoin Lvl 25.6 (*) 10.0 - 20.0 (ug/mL)   CK     Status: Abnormal   Collection Time   12/19/11  3:30 AM      Component Value Range Comment   Total CK 4808 (*) 7 - 232 (U/L)   GLUCOSE, CAPILLARY     Status: Normal   Collection Time   12/19/11  3:37 AM      Component Value Range Comment   Glucose-Capillary 73  70 - 99 (mg/dL)    Comment 1 Documented in Chart      Comment 2 Notify RN     GLUCOSE, CAPILLARY     Status: Abnormal   Collection Time   12/19/11  7:30 AM      Component Value Range Comment   Glucose-Capillary 69  (*) 70 - 99 (mg/dL)    Comment 1 Documented in Chart      Comment 2 Notify RN     GLUCOSE, CAPILLARY     Status: Normal   Collection Time   12/19/11 12:26 PM      Component Value Range Comment   Glucose-Capillary 74  70 - 99 (mg/dL)    Comment 1 Documented in Chart      Comment 2 Notify RN     BASIC METABOLIC PANEL     Status: Normal   Collection Time   12/20/11  4:22 AM      Component Value Range Comment   Sodium 139  135 - 145 (mEq/L)    Potassium 3.7  3.5 - 5.1 (mEq/L)    Chloride 105  96 - 112 (mEq/L)    CO2 24  19 - 32 (mEq/L)    Glucose, Bld 80  70 - 99 (mg/dL)    BUN 10  6 - 23 (mg/dL)    Creatinine, Ser 7.82  0.50 - 1.35 (mg/dL)    Calcium 8.4  8.4 - 10.5 (mg/dL)    GFR calc non Af Amer >90  >90 (mL/min)    GFR calc Af Amer >90  >90 (mL/min)   CBC     Status: Abnormal   Collection Time   12/20/11  4:22 AM      Component Value Range Comment   WBC 8.6  4.0 - 10.5 (K/uL)    RBC 4.99  4.22 - 5.81 (MIL/uL)    Hemoglobin 15.9  13.0 - 17.0 (g/dL)    HCT 95.6  21.3 - 08.6 (%)    MCV 84.8  78.0 - 100.0 (fL)    MCH 31.9  26.0 - 34.0 (pg)    MCHC 37.6 (*) 30.0 - 36.0 (g/dL)    RDW 57.8  46.9 - 62.9 (%)    Platelets 226  150 - 400 (K/uL)   PHENYTOIN LEVEL, TOTAL     Status: Abnormal   Collection Time   12/20/11  4:22 AM      Component Value Range Comment   Phenytoin Lvl 22.1 (*) 10.0 - 20.0 (ug/mL)   CK     Status: Abnormal   Collection Time   12/20/11  4:22 AM      Component Value Range Comment   Total CK 4792 (*) 7 - 232 (U/L)     Recent Results (from the past 240 hour(s))  MRSA PCR SCREENING     Status: Normal   Collection Time   12/17/11 11:49 AM      Component Value Range Status Comment   MRSA by PCR NEGATIVE  NEGATIVE  Final     Lipid Panel No results found for this basename: CHOL,TRIG,HDL,CHOLHDL,VLDL,LDLCALC in the last 72 hours  Studies/Results: No results found.  Medications:  I have reviewed the patient's current medications. Scheduled:   .  metoprolol tartrate  25 mg Oral BID  . phenytoin  100 mg Oral BID    Assessment/Plan:  Patient Active Hospital Problem List: Status epilepticus (07/30/2011)   Assessment: No further seizures noted.  Patient with Dilantin level starting to fall after holding dose yesterday but remains supratherapeutic.  Patient asymptomatic.     Plan:  1.  May discharge patient on 200mg  daily of Dilantin.  Encourage follow up.     LOS: 3 days   Thana Farr, MD [facility] [phone] 12/20/2011  3:16 PM

## 2011-12-23 LAB — GLUCOSE, CAPILLARY: Glucose-Capillary: 76 mg/dL (ref 70–99)

## 2012-10-24 IMAGING — CT CT HEAD W/O CM
2 series · 17 of 30 positions shown, 20 images · non-contrast
Comparison: MRI brain 07/31/2011.

CLINICAL DATA: Seizures.

CT HEAD WITHOUT CONTRAST
TECHNIQUE: Contiguous axial images were obtained from the base of
the skull through the vertex without contrast.

[Series 2: head w/o · axial · non-contrast · 0.43mm/px · z∈[-166,-56]mm · 9 of 28 slices shown, 12 images]
[im 3/28  brain]
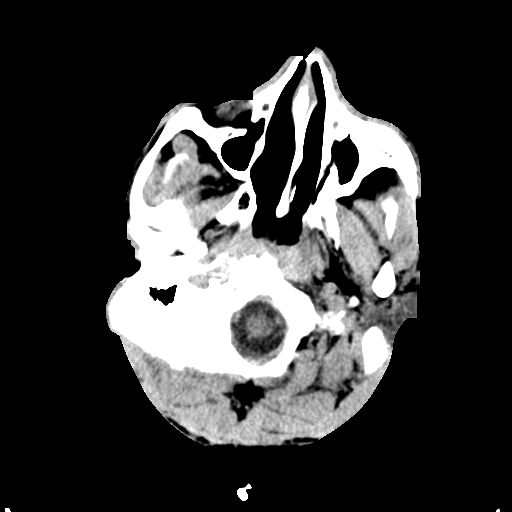
[im 3/28  bone]
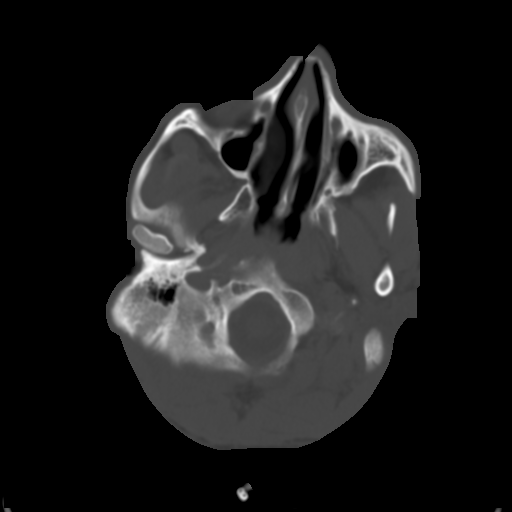
[im 6/28  brain]
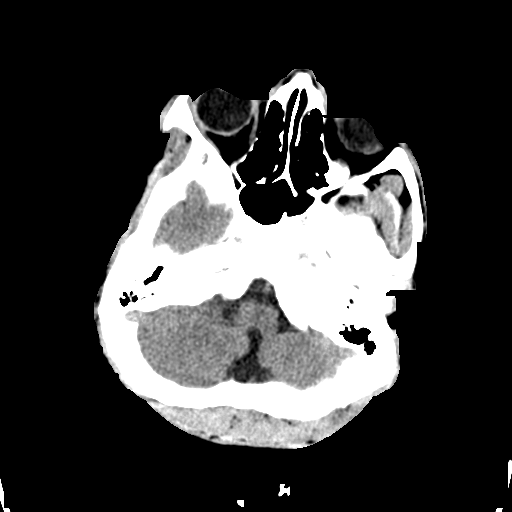
[im 9/28  brain]
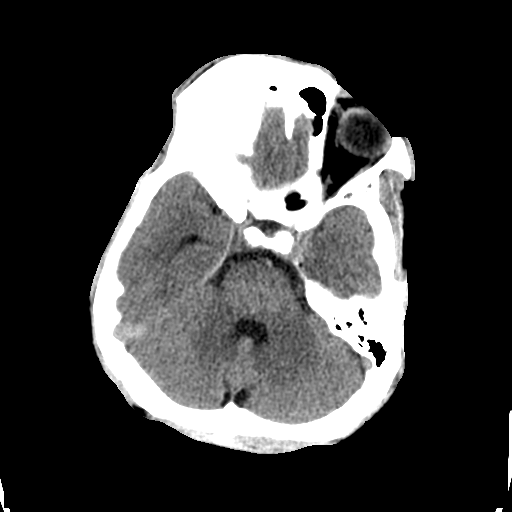
[im 11/28  brain]
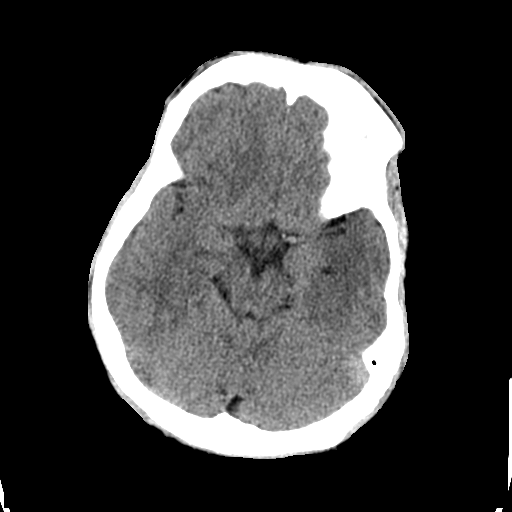
[im 14/28  brain]
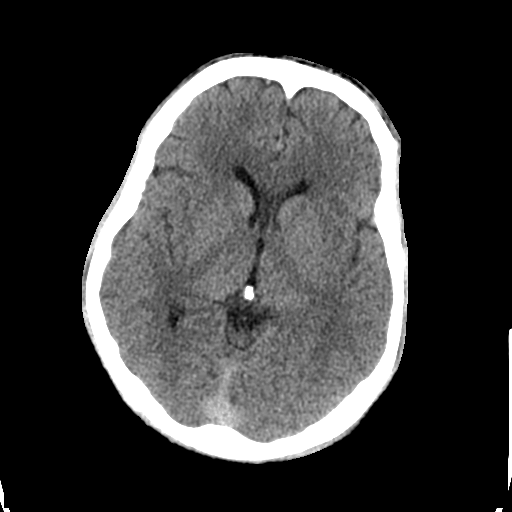
[im 14/28  bone]
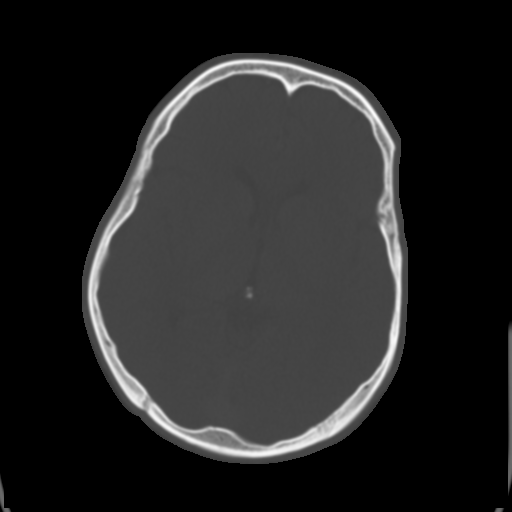
[im 17/28  brain]
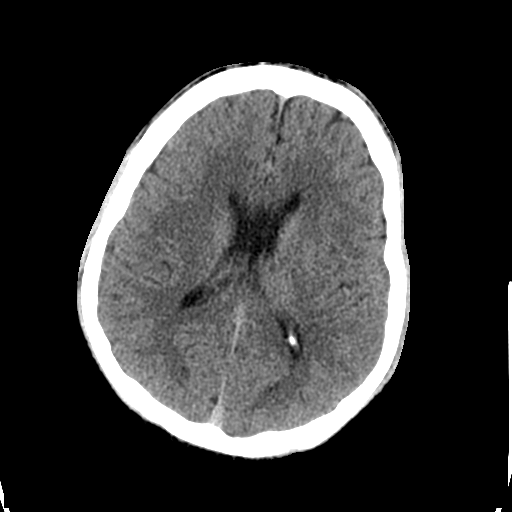
[im 19/28  brain]
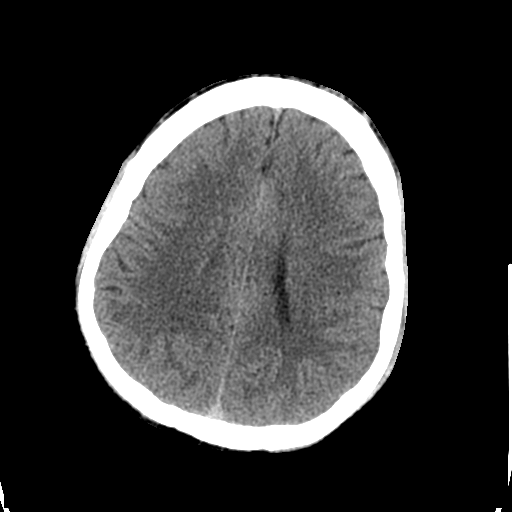
[im 22/28  brain]
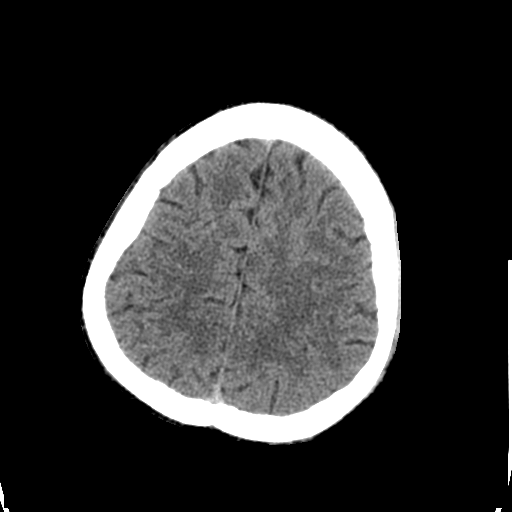
[im 25/28  brain]
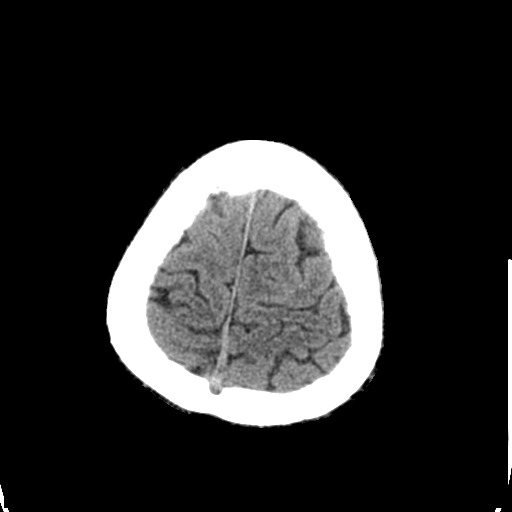
[im 25/28  bone]
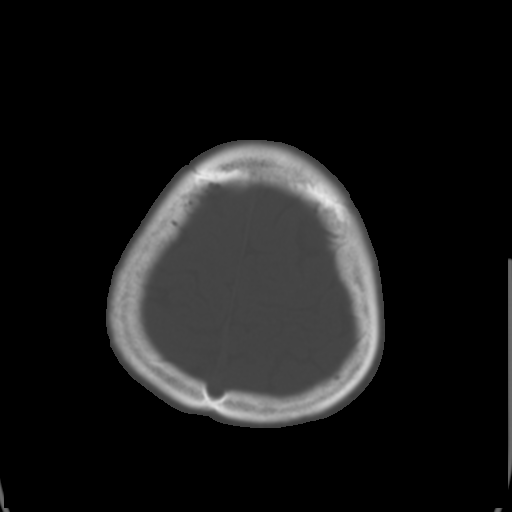

[Series 3: bone windows · axial · 0.43mm/px · z∈[-161,-56]mm · 8 of 47 slices shown]
[im 6/47  bone]
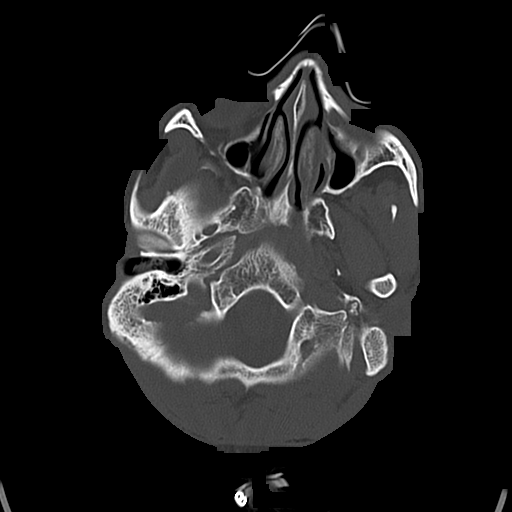
[im 11/47  bone]
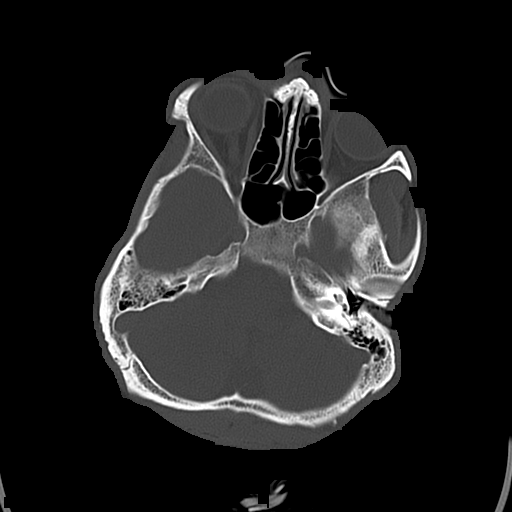
[im 16/47  bone]
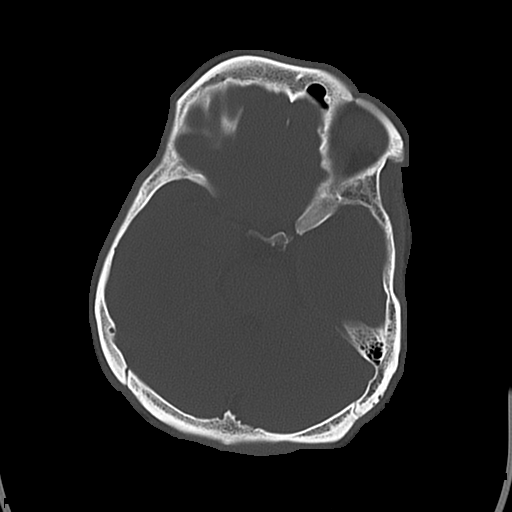
[im 21/47  bone]
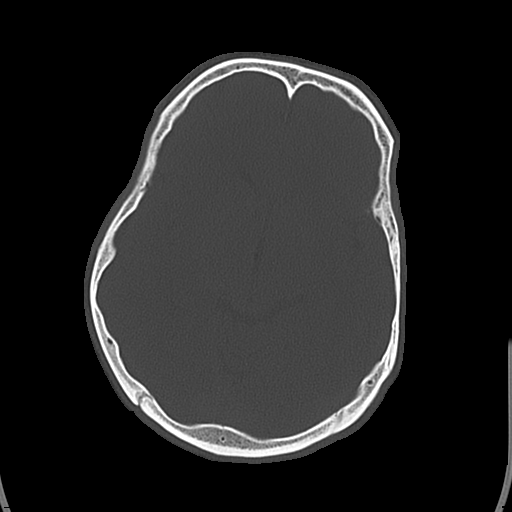
[im 26/47  bone]
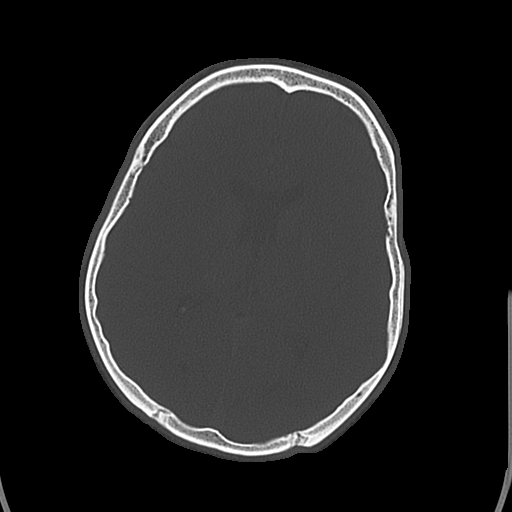
[im 31/47  bone]
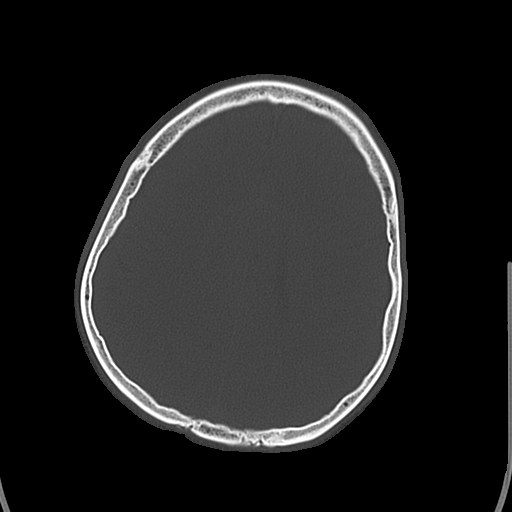
[im 36/47  bone]
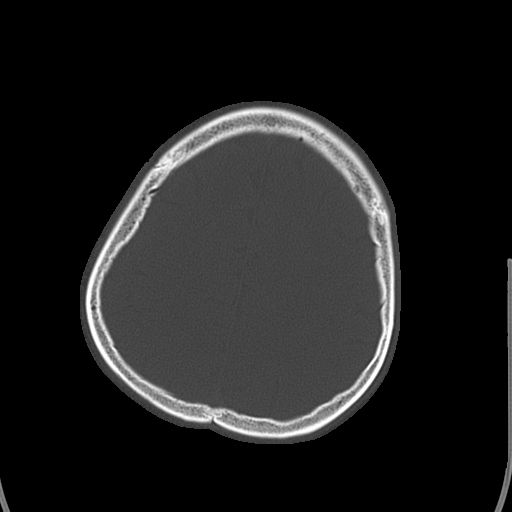
[im 41/47  bone]
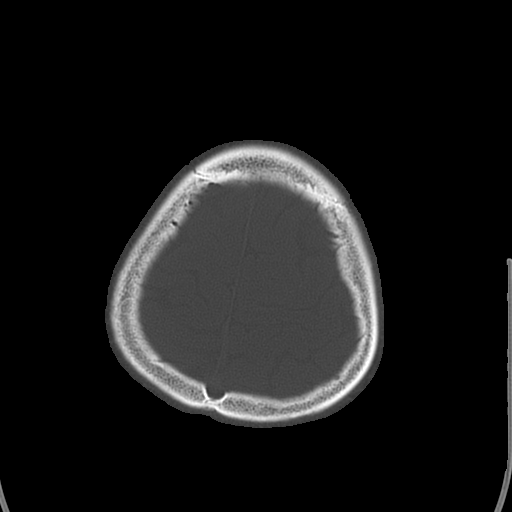

[17 of 30 positions shown; findings below may reference images not displayed]

FINDINGS: No acute infarct, hemorrhage, mass lesion is present.
The ventricles are of normal size.  Incidental note is made of a
persistent cavum septum pellucidum.  No significant extra-axial
fluid collection is present.  A nasal airway is in place.  The
maxillary sinuses are small bilaterally.  The paranasal sinuses and
mastoid air cells are clear.  The osseous skull is intact.
IMPRESSION: 1.  Normal CT appearance of the brain.
2.  Small bilateral maxillary sinuses, compatible with a history of
chronic disease.

## 2014-02-27 ENCOUNTER — Encounter (HOSPITAL_COMMUNITY): Payer: Self-pay | Admitting: Emergency Medicine

## 2014-02-27 ENCOUNTER — Observation Stay (HOSPITAL_COMMUNITY)
Admission: EM | Admit: 2014-02-27 | Discharge: 2014-02-28 | Disposition: A | Discharge status: Home / Self Care | Payer: Self-pay | Attending: Internal Medicine | Admitting: Internal Medicine

## 2014-02-27 ENCOUNTER — Emergency Department (HOSPITAL_COMMUNITY): Payer: Self-pay

## 2014-02-27 DIAGNOSIS — Z9119 Patient's noncompliance with other medical treatment and regimen: Secondary | ICD-10-CM | POA: Insufficient documentation

## 2014-02-27 DIAGNOSIS — R569 Unspecified convulsions: Secondary | ICD-10-CM | POA: Diagnosis present

## 2014-02-27 DIAGNOSIS — D72829 Elevated white blood cell count, unspecified: Secondary | ICD-10-CM | POA: Insufficient documentation

## 2014-02-27 DIAGNOSIS — G40909 Epilepsy, unspecified, not intractable, without status epilepticus: Principal | ICD-10-CM | POA: Insufficient documentation

## 2014-02-27 DIAGNOSIS — Z91199 Patient's noncompliance with other medical treatment and regimen due to unspecified reason: Secondary | ICD-10-CM | POA: Insufficient documentation

## 2014-02-27 DIAGNOSIS — E876 Hypokalemia: Secondary | ICD-10-CM | POA: Insufficient documentation

## 2014-02-27 LAB — CBC WITH DIFFERENTIAL/PLATELET
BASOS ABS: 0 10*3/uL (ref 0.0–0.1)
Basophils Relative: 0 % (ref 0–1)
EOS ABS: 0 10*3/uL (ref 0.0–0.7)
EOS PCT: 0 % (ref 0–5)
HCT: 42.1 % (ref 39.0–52.0)
Hemoglobin: 15.8 g/dL (ref 13.0–17.0)
LYMPHS ABS: 0.7 10*3/uL (ref 0.7–4.0)
Lymphocytes Relative: 5 % — ABNORMAL LOW (ref 12–46)
MCH: 32.6 pg (ref 26.0–34.0)
MCHC: 37.5 g/dL — AB (ref 30.0–36.0)
MCV: 86.8 fL (ref 78.0–100.0)
MONO ABS: 1.1 10*3/uL — AB (ref 0.1–1.0)
Monocytes Relative: 8 % (ref 3–12)
NEUTROS PCT: 87 % — AB (ref 43–77)
Neutro Abs: 12.3 10*3/uL — ABNORMAL HIGH (ref 1.7–7.7)
PLATELETS: 167 10*3/uL (ref 150–400)
RBC: 4.85 MIL/uL (ref 4.22–5.81)
RDW: 13 % (ref 11.5–15.5)
WBC: 14.1 10*3/uL — ABNORMAL HIGH (ref 4.0–10.5)

## 2014-02-27 LAB — ETHANOL: Alcohol, Ethyl (B): <11 mg/dL (ref 0–11)

## 2014-02-27 LAB — COMPREHENSIVE METABOLIC PANEL
ALT: 27 U/L (ref 0–53)
AST: 33 U/L (ref 0–37)
Albumin: 3.7 g/dL (ref 3.5–5.2)
Alkaline Phosphatase: 92 U/L (ref 39–117)
Anion gap: 12 (ref 5–15)
BILIRUBIN TOTAL: 0.6 mg/dL (ref 0.3–1.2)
BUN: 18 mg/dL (ref 6–23)
CHLORIDE: 102 meq/L (ref 96–112)
CO2: 25 meq/L (ref 19–32)
CREATININE: 1.14 mg/dL (ref 0.50–1.35)
Calcium: 9.1 mg/dL (ref 8.4–10.5)
GFR, EST NON AFRICAN AMERICAN: 86 mL/min — AB (ref >90)
Glucose, Bld: 98 mg/dL (ref 70–99)
Potassium: 3.3 mEq/L — ABNORMAL LOW (ref 3.7–5.3)
SODIUM: 139 meq/L (ref 137–147)
Total Protein: 7.7 g/dL (ref 6.0–8.3)

## 2014-02-27 LAB — RAPID URINE DRUG SCREEN, HOSP PERFORMED
AMPHETAMINES: NOT DETECTED
Barbiturates: NOT DETECTED
Benzodiazepines: POSITIVE — AB
Cocaine: NOT DETECTED
Opiates: NOT DETECTED
Tetrahydrocannabinol: NOT DETECTED

## 2014-02-27 MED ORDER — LORAZEPAM 2 MG/ML IJ SOLN: 1.0000 mg | INTRAMUSCULAR | Status: DC | PRN

## 2014-02-27 MED ORDER — ONDANSETRON HCL 4 MG PO TABS: 4.0000 mg | ORAL_TABLET | Freq: Four times a day (QID) | ORAL | Status: DC | PRN

## 2014-02-27 MED ORDER — POTASSIUM CHLORIDE IN NACL 20-0.9 MEQ/L-% IV SOLN
INTRAVENOUS | Status: DC
Start: 2014-02-27 — End: 2014-02-28
  Administered 2014-02-27 – 2014-02-28 (×2): via INTRAVENOUS
  Filled 2014-02-27 (×2): qty 1000

## 2014-02-27 MED ORDER — SODIUM CHLORIDE 0.9 % IJ SOLN
3.0000 mL | Freq: Two times a day (BID) | INTRAMUSCULAR | Status: DC
  Administered 2014-02-27: 3 mL via INTRAVENOUS

## 2014-02-27 MED ORDER — ACETAMINOPHEN 325 MG PO TABS: 650.0000 mg | ORAL_TABLET | Freq: Four times a day (QID) | ORAL | Status: DC | PRN

## 2014-02-27 MED ORDER — SODIUM CHLORIDE 0.9 % IV SOLN
INTRAVENOUS | Status: DC
  Administered 2014-02-27: 11:00:00 via INTRAVENOUS

## 2014-02-27 MED ORDER — LEVETIRACETAM IN NACL 1000 MG/100ML IV SOLN
1000.0000 mg | Freq: Once | INTRAVENOUS | Status: AC
  Administered 2014-02-27: 1000 mg via INTRAVENOUS
  Filled 2014-02-27 (×2): qty 100

## 2014-02-27 MED ORDER — ENOXAPARIN SODIUM 40 MG/0.4ML ~~LOC~~ SOLN
40.0000 mg | SUBCUTANEOUS | Status: DC
  Administered 2014-02-27: 40 mg via SUBCUTANEOUS
  Filled 2014-02-27: qty 0.4

## 2014-02-27 MED ORDER — ACETAMINOPHEN 650 MG RE SUPP: 650.0000 mg | Freq: Four times a day (QID) | RECTAL | Status: DC | PRN

## 2014-02-27 MED ORDER — LEVETIRACETAM 500 MG PO TABS
500.0000 mg | ORAL_TABLET | Freq: Two times a day (BID) | ORAL | Status: DC
  Administered 2014-02-28: 500 mg via ORAL
  Filled 2014-02-27: qty 1

## 2014-02-27 MED ORDER — ONDANSETRON HCL 4 MG/2ML IJ SOLN: 4.0000 mg | Freq: Four times a day (QID) | INTRAMUSCULAR | Status: DC | PRN

## 2014-02-27 NOTE — ED Notes (Signed)
Contacted lab about wait time on CBC.

## 2014-02-27 NOTE — ED Notes (Signed)
Pt was able to stand independently. He is alert with minimal conversation;nurse informed.

## 2014-02-27 NOTE — ED Notes (Signed)
Per EMS, pt is here visiting sister, had 2 seizures this morning prior to EMS arrival and 3 seizures while EMS on scene. Pt has hx of epilepsy but takes medications usually. Unsure if he has been taking meds while he has been visiting his sister. Sinus tach with EMS, 5mg  of versed given en route. Oral airway in place. Pt very lethargic but arouseable, snoring respirations noted.

## 2014-02-27 NOTE — H&P (Signed)
[facility] History and Physical  [name] XBJ:[id number] DOB: May 27, 1986 DOA: 02/27/2014  Referring physician:  PCP: No primary provider on file.  Specialists:   Chief Complaint: seizure  HPI: Douglas Rivas is a 28 y.o. male with PMH of seizure disorder presented with 2 episodes of seizure in AM; Per EMS: family reported that pt was visiting his sister and had 2 seizures this morning. Apparently Pt does not think pt is taking his medicines.  -EMS stated pt had 3 more seizures en route. EMS gave IV versed with improvement. Pt is still mild post ichtal confused, denies focal neuro weakness, or paraesthesia, denies chest pain, no SOB, no nausea, vomiting or diarrhea;  -ED d/w with neuro who recommended to load with keppra and admit on observation    Review of Systems: The patient denies anorexia, fever, weight loss,, vision loss, decreased hearing, hoarseness, chest pain, syncope, dyspnea on exertion, peripheral edema, balance deficits, hemoptysis, abdominal pain, melena, hematochezia, severe indigestion/heartburn, hematuria, incontinence, genital sores, muscle weakness, suspicious skin lesions, transient blindness, difficulty walking, depression, unusual weight change, abnormal bleeding, enlarged lymph nodes, angioedema, and breast masses.    Past Medical History  Diagnosis Date  . Seizures    No past surgical history on file. Social History:  has no tobacco, alcohol, and drug history on file. Home;  where does patient live--home, ALF, SNF? and with whom if at home? Yes;  Can patient participate in ADLs?  Allergies not on file  No family history on file.  (be sure to complete)  Prior to Admission medications   Not on File   Physical Exam: Filed Vitals:   02/27/14 1407  BP: 138/70  Pulse: 88  Resp: 19     General:  Alert, oriented   Eyes: eom-i, perrla   ENT: some tongue erytghema;   Neck: supple   Cardiovascular: s1,s2 rrr  Respiratory: CTA  BL  Abdomen: soft, nt,nd   Skin: no rash   Musculoskeletal: no LE edema  Psychiatric: no hallucinations   Neurologic: CN 2-12 intact, motor 5/5 BL symmetric   Labs on Admission:  Basic Metabolic Panel:  Recent Labs Lab 02/27/14 1047  NA 139  K 3.3*  CL 102  CO2 25  GLUCOSE 98  BUN 18  CREATININE 1.14  CALCIUM 9.1   Liver Function Tests:  Recent Labs Lab 02/27/14 1047  AST 33  ALT 27  ALKPHOS 92  BILITOT 0.6  PROT 7.7  ALBUMIN 3.7   No results found for this basename: LIPASE, AMYLASE,  in the last 168 hours No results found for this basename: AMMONIA,  in the last 168 hours CBC: No results found for this basename: WBC, NEUTROABS, HGB, HCT, MCV, PLT,  in the last 168 hours Cardiac Enzymes: No results found for this basename: CKTOTAL, CKMB, CKMBINDEX, TROPONINI,  in the last 168 hours  BNP (last 3 results) No results found for this basename: PROBNP,  in the last 8760 hours CBG: No results found for this basename: GLUCAP,  in the last 168 hours  Radiological Exams on Admission: Dg Chest Port 1 View  02/27/2014   CLINICAL DATA:  Seizures  EXAM: PORTABLE CHEST - 1 VIEW  COMPARISON:  None.  FINDINGS: The heart size and mediastinal contours are within normal limits. Both lungs are clear. The visualized skeletal structures are unremarkable.  IMPRESSION: No active disease.   Electronically Signed   By: Ruel Favors M.D.   On: 02/27/2014 10:34    EKG: Independently reviewed.  Assessment/Plan Active Problems:   Seizure  28 y.o. male with PMH of seizure disorder presented with 2 episodes of seizure in AM; had 3 more seizures in EMS;  -ED d/w Dr. Roseanne RenoStewart who will be on consult     1. Seizure; Per EMS notes (famiyl reported non compliance to meds);  -loaded with IV keppra in ED; cont prn ativan; start PO keppra in AM; consult pharmacy to obtain meds history  -f/u drug screen, seizure precautions   2. Leukocytosis; likely reactive; no s/s of infection; CXR: no  clear infiltrates;  -obtain UA, monitor  3. Hypo K; replace   Consult CM to obtain family contacts   Neuro';  if consultant consulted, please document name and whether formally or informally consulted  Code Status: full (must indicate code status--if unknown or must be presumed, indicate so) Family Communication:  D/w patient, ED (indicate person spoken with, if applicable, with phone number if by telephone) Disposition Plan: home 24-48 hrs  (indicate anticipated LOS)  Time spent: >35 minutes   Esperanza SheetsBURIEV, Amillion Macchia N [facility] Pager [phone]  If 7PM-7AM, please contact night-coverage [email or web address] Password [email or web address] 02/27/2014, 2:27 PM

## 2014-02-27 NOTE — ED Provider Notes (Signed)
CSN: [id number]     Arrival date & time 02/27/14  4098 History   First MD Initiated Contact with Patient 02/27/14 [phone]     Chief Complaint  Patient presents with  . Seizures      Patient is a 28 y.o. male presenting with seizures. The history is provided by the EMS personnel. The history is limited by the condition of the patient (Post-ictal, s/p IV versed).  Seizures Pt was seen at 0955. Per EMS and family report: pt's sister told EMS pt was visiting her and had 2 seizures this morning. Pt has hx of seizure disorder but she does not think pt is taking his medicines. Unknown what medicines pt is prescribed. EMS states pt had 3 more seizures en route. EMS gave IV versed with improvement. Pt is post-ictal on arrival to the ED.     Past Medical History  Diagnosis Date  . Seizures    No past surgical history on file.  History  Substance Use Topics  . Smoking status: Not on file  . Smokeless tobacco: Not on file  . Alcohol Use: Not on file    Review of Systems  Unable to perform ROS: Mental status change  Neurological: Positive for seizures.      Allergies  Review of patient's allergies indicates not on file.  Home Medications   Prior to Admission medications   Not on File   BP 138/70  Pulse 88  Resp 19  SpO2 98% Physical Exam 1000: Physical examination:  Nursing notes reviewed; Vital signs and O2 SAT reviewed;  Constitutional: Well developed, Well nourished, Well hydrated, In no acute distress; Head:  Normocephalic, atraumatic; Eyes: EOMI, PERRL, No scleral icterus; ENMT: Mouth and pharynx normal, Mucous membranes moist. +abrasion to tongue.; Neck: Supple, Full range of motion, No lymphadenopathy; Cardiovascular: Regular rate and rhythm, No gallop; Respiratory: Breath sounds clear & equal bilaterally, No wheezes.  Speaking full sentences with ease, Normal respiratory effort/excursion; Chest: Nontender, Movement normal; Abdomen: Soft, Nontender, Nondistended, Normal bowel  sounds; Genitourinary: No CVA tenderness; Extremities: Pulses normal, No tenderness, No edema, No calf edema or asymmetry.; Neuro: Lethargic, will open his eyes to name/touch. No facial droop. Moves all extremities spontaneously on stretcher.; Skin: Color normal, Warm, Dry.   ED Course  Procedures    EKG Interpretation   Date/Time:  Thursday February 27 2014 09:27:15 EDT Ventricular Rate:  101 PR Interval:  144 QRS Duration: 94 QT Interval:  346 QTC Calculation: 448 R Axis:   79 Text Interpretation:  Sinus tachycardia Probable left atrial enlargement  Probable left ventricular hypertrophy Baseline wander No old tracing to  compare Confirmed by [facility]  MD, Nicholos Johns [phone]) on 02/27/2014 9:32:52  AM      MDM  MDM Reviewed: nursing note and vitals Interpretation: labs, ECG and x-ray   Results for orders placed during the hospital encounter of 02/27/14  ETHANOL      Result Value Ref Range   Alcohol, Ethyl (B) <11  0 - 11 mg/dL  COMPREHENSIVE METABOLIC PANEL      Result Value Ref Range   Sodium 139  137 - 147 mEq/L   Potassium 3.3 (*) 3.7 - 5.3 mEq/L   Chloride 102  96 - 112 mEq/L   CO2 25  19 - 32 mEq/L   Glucose, Bld 98  70 - 99 mg/dL   BUN 18  6 - 23 mg/dL   Creatinine, Ser 5.62  0.50 - 1.35 mg/dL   Calcium 9.1  8.4 -  10.5 mg/dL   Total Protein 7.7  6.0 - 8.3 g/dL   Albumin 3.7  3.5 - 5.2 g/dL   AST 33  0 - 37 U/L   ALT 27  0 - 53 U/L   Alkaline Phosphatase 92  39 - 117 U/L   Total Bilirubin 0.6  0.3 - 1.2 mg/dL   GFR calc non Af Amer 86 (*) >90 mL/min   GFR calc Af Amer >90  >90 mL/min   Anion gap 12  5 - 15  CBC WITH DIFFERENTIAL      Result Value Ref Range   WBC 14.1 (*) 4.0 - 10.5 K/uL   RBC 4.85  4.22 - 5.81 MIL/uL   Hemoglobin 15.8  13.0 - 17.0 g/dL   HCT 16.142.1  09.639.0 - 04.552.0 %   MCV 86.8  78.0 - 100.0 fL   MCH 32.6  26.0 - 34.0 pg   MCHC 37.5 (*) 30.0 - 36.0 g/dL   RDW 40.913.0  81.111.5 - 91.415.5 %   Platelets 167  150 - 400 K/uL   Neutrophils Relative % 87 (*)  43 - 77 %   Lymphocytes Relative 5 (*) 12 - 46 %   Monocytes Relative 8  3 - 12 %   Eosinophils Relative 0  0 - 5 %   Basophils Relative 0  0 - 1 %   Neutro Abs 12.3 (*) 1.7 - 7.7 K/uL   Lymphs Abs 0.7  0.7 - 4.0 K/uL   Monocytes Absolute 1.1 (*) 0.1 - 1.0 K/uL   Eosinophils Absolute 0.0  0.0 - 0.7 K/uL   Basophils Absolute 0.0  0.0 - 0.1 K/uL   Smear Review MORPHOLOGY UNREMARKABLE     Dg Chest Port 1 View 02/27/2014   CLINICAL DATA:  Seizures  EXAM: PORTABLE CHEST - 1 VIEW  COMPARISON:  None.  FINDINGS: The heart size and mediastinal contours are within normal limits. Both lungs are clear. The visualized skeletal structures are unremarkable.  IMPRESSION: No active disease.   Electronically Signed   By: Ruel Favorsrevor  Shick M.D.   On: 02/27/2014 10:34    1240:  Pt more awake now, will not answer questions however; he either shakes his head "yes" and "no" or shrugs his shoulders. No further seizures since arrival to ED. T/C to Neuro Dr. Roseanne RenoStewart, case discussed, including:  HPI, pertinent PM/SHx, VS/PE, dx testing, ED course and treatment:  Agreeable to consult, requests to dose IV keppra 1gram, admit to medicine service.   1410: Pt now sitting upright on stretcher, A&O. States he "can't remember" the name of his seizure medicines.  T/C to [facility] Dr. York SpanielBuriev, case discussed, including:  HPI, pertinent PM/SHx, VS/PE, dx testing, ED course and treatment:  Agreeable to admit, requests he will come to the ED for evaluation.   Samuel JesterKathleen Seniyah Esker, DO 03/01/14 1538

## 2014-02-27 NOTE — ED Notes (Signed)
Admitting MD at bedside.

## 2014-02-28 LAB — URINALYSIS, ROUTINE W REFLEX MICROSCOPIC
BILIRUBIN URINE: NEGATIVE
GLUCOSE, UA: NEGATIVE mg/dL
HGB URINE DIPSTICK: NEGATIVE
Ketones, ur: NEGATIVE mg/dL
Leukocytes, UA: NEGATIVE
Nitrite: NEGATIVE
PH: 6 (ref 5.0–8.0)
Protein, ur: NEGATIVE mg/dL
SPECIFIC GRAVITY, URINE: 1.015 (ref 1.005–1.030)
UROBILINOGEN UA: 1 mg/dL (ref 0.0–1.0)

## 2014-02-28 LAB — CBC
HEMATOCRIT: 43.8 % (ref 39.0–52.0)
Hemoglobin: 16.6 g/dL (ref 13.0–17.0)
MCH: 33.3 pg (ref 26.0–34.0)
MCHC: 37.9 g/dL — ABNORMAL HIGH (ref 30.0–36.0)
MCV: 88 fL (ref 78.0–100.0)
PLATELETS: 180 10*3/uL (ref 150–400)
RBC: 4.98 MIL/uL (ref 4.22–5.81)
RDW: 13.2 % (ref 11.5–15.5)
WBC: 8.8 10*3/uL (ref 4.0–10.5)

## 2014-02-28 MED ORDER — LEVETIRACETAM 500 MG PO TABS: 500.0000 mg | ORAL_TABLET | Freq: Two times a day (BID) | ORAL | Status: AC

## 2014-02-28 NOTE — Progress Notes (Signed)
Nutrition Brief Note  Patient identified on the Malnutrition Screening Tool (MST) Report  Wt Readings from Last 15 Encounters:  02/27/14 110 lb 14.4 oz (50.304 kg)    Body mass index is 17.91 kg/(m^2). Patient meets criteria for Underweight based on current BMI. Pt denies any recent weight loss and reports he was eating well PTA. He is unsure how much he usually weighs but, states 110 lbs is normal for him.   Pt states his appetite is good/normal. Current diet order is Regular, patient is consuming approximately 100% of meals at this time. Pt refused any additional snacks or nutritional supplements at this time. RD encouraged PO intake. Performed nutrition-focused physical exam; pt appears well nourished except for some mild wasting of thigh and patellar region. Labs and medications reviewed.   No nutrition interventions warranted at this time. If nutrition issues arise, please consult RD.   Ian Malkineanne Barnett RD, LDN Inpatient Clinical Dietitian Pager: [phone] After Hours Pager: [phone]

## 2014-02-28 NOTE — Progress Notes (Signed)
[facility] PROGRESS NOTE  Dwain Sarna WUJ:[id number] DOB: Jul 17, 1985 DOA: 02/27/2014 PCP: No primary provider on file.  Assessment/Plan: Active Problems:  Seizure   29 y.o. male with PMH of seizure disorder presented with 2 episodes of seizure in AM; had 3 more seizures in EMS;   1. Seizure; Per EMS notes (famiyl reported non compliance to meds);  -loaded with IV keppra in ED; cont prn ativan;  -no new seizures; called pharmacy to investigate medication history;l d/w neurology will cont keppra for now   2. Leukocytosis; likely reactive; no s/s of infection; CXR: no clear infiltrates; leukocytosis resolved   3. Hypo K; replace      Code Status: full Family Communication: d/w patient, his brother at [phone] (indicate person spoken with, relationship, and if by phone, the number) Disposition Plan: home 24-48 hrs    Consultants: Neurology   Procedures:  none  Antibiotics:  none (indicate start date, and stop date if known)  HPI/Subjective: alert  Objective: Filed Vitals:   02/28/14 0700  BP: 140/85  Pulse: 62  Temp: 97.9 F (36.6 C)  Resp: 19    Intake/Output Summary (Last 24 hours) at 02/28/14 1004 Last data filed at 02/28/14 0817  Gross per 24 hour  Intake   1120 ml  Output      0 ml  Net   1120 ml   Filed Weights   02/27/14 1649  Weight: 50.304 kg (110 lb 14.4 oz)    Exam:   General:  alert  Cardiovascular: s1,s2 rrr  Respiratory: CTA BL  Abdomen: soft, nt,nd   Musculoskeletal: no LE edema   Data Reviewed: Basic Metabolic Panel:  Recent Labs Lab 02/27/14 1047  NA 139  K 3.3*  CL 102  CO2 25  GLUCOSE 98  BUN 18  CREATININE 1.14  CALCIUM 9.1   Liver Function Tests:  Recent Labs Lab 02/27/14 1047  AST 33  ALT 27  ALKPHOS 92  BILITOT 0.6  PROT 7.7  ALBUMIN 3.7   No results found for this basename: LIPASE, AMYLASE,  in the last 168 hours No results found for this basename: AMMONIA,  in the last 168  hours CBC:  Recent Labs Lab 02/27/14 1155 02/28/14 0500  WBC 14.1* 8.8  NEUTROABS 12.3*  --   HGB 15.8 16.6  HCT 42.1 43.8  MCV 86.8 88.0  PLT 167 180   Cardiac Enzymes: No results found for this basename: CKTOTAL, CKMB, CKMBINDEX, TROPONINI,  in the last 168 hours BNP (last 3 results) No results found for this basename: PROBNP,  in the last 8760 hours CBG: No results found for this basename: GLUCAP,  in the last 168 hours  No results found for this or any previous visit (from the past 240 hour(s)).   Studies: Dg Chest Port 1 View  02/27/2014   CLINICAL DATA:  Seizures  EXAM: PORTABLE CHEST - 1 VIEW  COMPARISON:  None.  FINDINGS: The heart size and mediastinal contours are within normal limits. Both lungs are clear. The visualized skeletal structures are unremarkable.  IMPRESSION: No active disease.   Electronically Signed   By: Ruel Favors M.D.   On: 02/27/2014 10:34    Scheduled Meds: . enoxaparin (LOVENOX) injection  40 mg Subcutaneous Q24H  . levETIRAcetam  500 mg Oral BID  . sodium chloride  3 mL Intravenous Q12H   Continuous Infusions: . 0.9 % NaCl with KCl 20 mEq / L 100 mL/hr at 02/28/14 0544    Active Problems:  Seizure    Time spent: >35 minutes     Esperanza SheetsBURIEV, Timberlyn Pickford N  [facility] Pager [phone]. If 7PM-7AM, please contact night-coverage at [email or web address], password [email or web address] 02/28/2014, 10:04 AM  LOS: 1 day

## 2014-02-28 NOTE — Discharge Instructions (Signed)
Please follow up with neurology in 1 week

## 2014-02-28 NOTE — Progress Notes (Signed)
Discharge orders received, pt for discharge home today,  IV and telemtry D/C. D/C instructions and Rx given with verbalized understanding.  Family at bedside to assist pt with discharge. Staff brought pt downstairs via wheelchair.  

## 2014-02-28 NOTE — Discharge Summary (Signed)
Physician Discharge Summary  Douglas Rivas ZOX:[id number] DOB: 03-25-1986 DOA: 02/27/2014  PCP: No primary provider on file.  Admit date: 02/27/2014 Discharge date: 02/28/2014  Time spent: >35 minutes  Recommendations for Outpatient Follow-up:  F/u with neurology in 1 week  F/u at wellness center in 1 week   Discharge Diagnoses:  Active Problems:   Seizure   Discharge Condition: stable   Diet recommendation: regular   Filed Weights   02/27/14 1649  Weight: 50.304 kg (110 lb 14.4 oz)    History of present illness:  28 y.o. male with PMH of seizure disorder presented with 2 episodes of seizure in AM; had 3 more seizures in EMS;  -apparently Pt was not taking his medications since January   Hospital Course:  1. Seizure; Per EMS notes (family reported non compliance to meds);  -loaded with IV keppra in ED; no new seizures, neuro exam no focal; l d/w neurology will cont keppra for now and outpatient follow up with neurology  2. Leukocytosis; likely reactive; no s/s of infection; CXR: no clear infiltrates; leukocytosis resolved  3. Hypo K; replaced   called d/w with his brother, recommended to f/u with neurology in 1 week   Procedures:  none (i.e. Studies not automatically included, echos, thoracentesis, etc; not x-rays)  Consultations:  Neurology, phone   Discharge Exam: Filed Vitals:   02/28/14 1005  BP: 140/88  Pulse: 81  Temp: 98.1 F (36.7 C)  Resp: 18    General: alert Cardiovascular: s1,s2 rrr Respiratory: CTA BL  Discharge Instructions  Discharge Instructions   Diet - low sodium heart healthy    Complete by:  As directed      Discharge instructions    Complete by:  As directed   Please follow up with neurology in 1 week     Increase activity slowly    Complete by:  As directed             Medication List         levETIRAcetam 500 MG tablet  Commonly known as:  KEPPRA  Take 1 tablet (500 mg total) by mouth 2 (two) times daily.        No Known Allergies     Follow-up Information   Follow up with [facility]. (Call Monday morning for an appointment for as soon as possible after discharge.)    Contact information:   [address] [place] [address] [phone]      Follow up with [facility]. Schedule an appointment as soon as possible for a visit in 1 week.   Contact information:   [address] [place] [address] [phone]       The results of significant diagnostics from this hospitalization (including imaging, microbiology, ancillary and laboratory) are listed below for reference.    Significant Diagnostic Studies: Dg Chest Port 1 View  02/27/2014   CLINICAL DATA:  Seizures  EXAM: PORTABLE CHEST - 1 VIEW  COMPARISON:  None.  FINDINGS: The heart size and mediastinal contours are within normal limits. Both lungs are clear. The visualized skeletal structures are unremarkable.  IMPRESSION: No active disease.   Electronically Signed   By: Ruel Favors M.D.   On: 02/27/2014 10:34    Microbiology: No results found for this or any previous visit (from the past 240 hour(s)).   Labs: Basic Metabolic Panel:  Recent Labs Lab 02/27/14 1047  NA 139  K 3.3*  CL 102  CO2 25  GLUCOSE 98  BUN 18  CREATININE 1.14  CALCIUM 9.1   Liver Function Tests:  Recent Labs Lab 02/27/14 1047  AST 33  ALT 27  ALKPHOS 92  BILITOT 0.6  PROT 7.7  ALBUMIN 3.7   No results found for this basename: LIPASE, AMYLASE,  in the last 168 hours No results found for this basename: AMMONIA,  in the last 168 hours CBC:  Recent Labs Lab 02/27/14 1155 02/28/14 0500  WBC 14.1* 8.8  NEUTROABS 12.3*  --   HGB 15.8 16.6  HCT 42.1 43.8  MCV 86.8 88.0  PLT 167 180   Cardiac Enzymes: No results found for this basename: CKTOTAL, CKMB, CKMBINDEX, TROPONINI,  in the last 168 hours BNP: BNP (last 3 results) No results found for this basename: PROBNP,  in the  last 8760 hours CBG: No results found for this basename: GLUCAP,  in the last 168 hours     Signed:  Esperanza SheetsBURIEV, Okey Zelek N  [facility] 02/28/2014, 1:39 PM

## 2014-02-28 NOTE — Care Management Note (Addendum)
  Page 1 of 1   02/28/2014     11:48:12 AM CARE MANAGEMENT NOTE 02/28/2014  Patient:  Douglas Rivas,Douglas Rivas   Account Number:  [id number]  Date Initiated:  02/28/2014  Documentation initiated by:  Elmer BalesOBARGE,Lycan Davee  Subjective/Objective Assessment:   Patient was admitted with seizures. Reports medication non-compliance.  Patient lives at home with his brother, has no PCP and is uninsured.     Action/Plan:   Will follow for discharge needs   Anticipated DC Date:  02/28/2014   Anticipated DC Plan:  HOME/SELF CARE  In-house referral  Financial Counselor      DC Planning Services  CM consult  [facility]      Choice offered to / List presented to:             Status of service:  Completed, signed off Medicare Important Message given?   (If response is "NO", the following Medicare IM given date fields will be blank) Date Medicare IM given:   Medicare IM given by:   Date Additional Medicare IM given:   Additional Medicare IM given by:    Discharge Disposition:    Per UR Regulation:    If discussed at Long Length of Stay Meetings, dates discussed:    Comments:  02/28/14 1030 Elmer Balesourtney Dillie Burandt RN, MSN, CM- Dr York SpanielBuriev has spoke with patient's brother, who states that patient ran out of medications in January 2015 and was unable to get refills due to a lack of PCP.  CM spoke with patient, who gave [facility] states that they are not able to schedule an appointment today and would like patient to follow-up on Monday morning.  CM provided this information to patient in writing and also added it to the AVS.  Patient states that he is employeed and was able to obtain his medications when he had active prescriptions.  Per patient and brother, [facility] Dr York SpanielBuriev aware.  Patient's bedside RN was updated.

## 2014-02-28 NOTE — Progress Notes (Signed)
UR completed 

## 2014-03-25 ENCOUNTER — Encounter (HOSPITAL_COMMUNITY): Payer: Self-pay | Admitting: *Deleted

## 2014-03-28 ENCOUNTER — Ambulatory Visit: Payer: 59 | Admitting: Neurology

## 2014-03-31 ENCOUNTER — Encounter: Payer: Self-pay | Admitting: *Deleted

## 2014-03-31 ENCOUNTER — Telehealth: Payer: Self-pay | Admitting: Neurology

## 2014-03-31 NOTE — Progress Notes (Unsigned)
No show letter sent.

## 2014-03-31 NOTE — Telephone Encounter (Signed)
Pt no showed 03/28/14 appt w/ Dr. Karel Jarvis. Pt was referred as a NP by the ER.   Alcario Drought - please send pt a no show letter / Oneita Kras.

## 2015-01-05 IMAGING — CR DG CHEST 1V PORT
1 series · 1 of 1 positions shown · non-contrast
Comparison: None.

CLINICAL DATA: Seizures

EXAM:
PORTABLE CHEST - 1 VIEW

[AP]
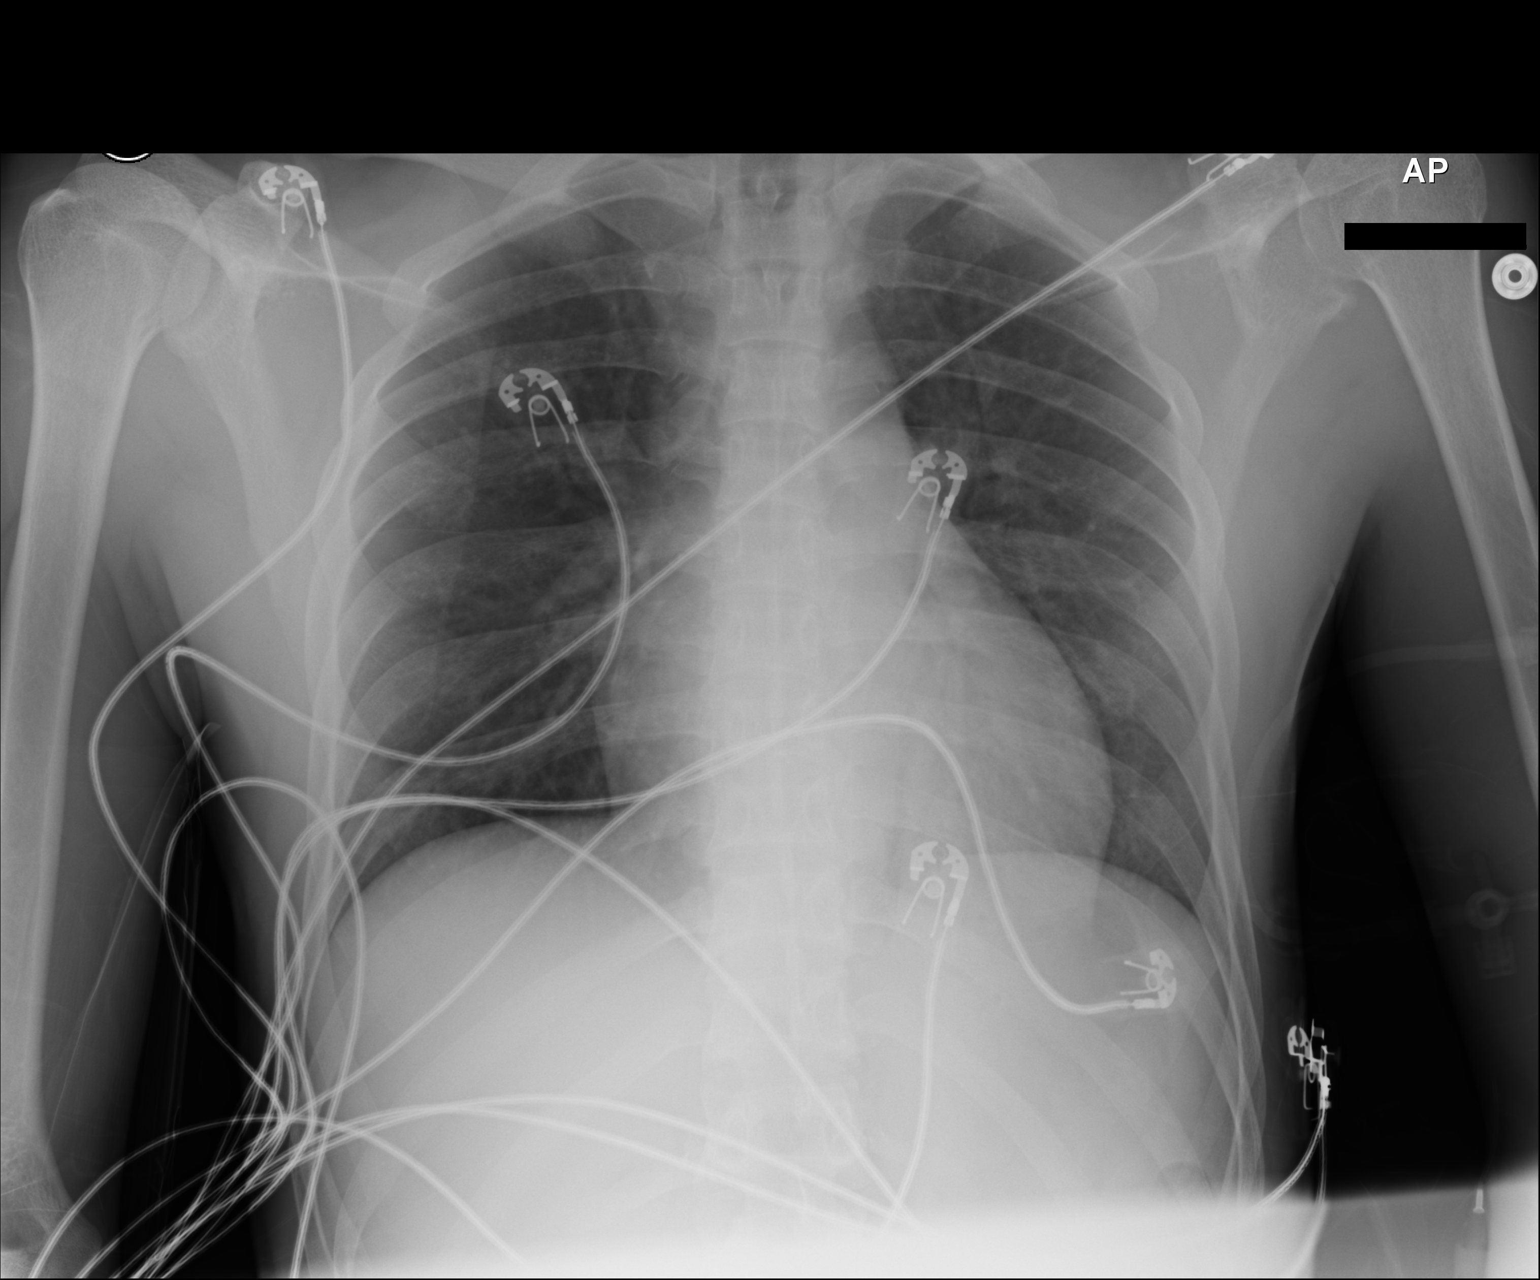

[1 of 1 positions shown; findings below may reference images not displayed]

FINDINGS: The heart size and mediastinal contours are within normal limits.
Both lungs are clear. The visualized skeletal structures are
unremarkable.
IMPRESSION: No active disease.
